# Patient Record
Sex: Female | Born: 1955 | Race: White | Hispanic: No | Marital: Single | Smoking: Current every day smoker
Health system: Southern US, Community
[De-identification: ages and names within clinical notes are randomized; demographics above are authoritative.]

## PROBLEM LIST (undated history)

## (undated) ENCOUNTER — Telehealth

## (undated) ENCOUNTER — Encounter

## (undated) ENCOUNTER — Ambulatory Visit
Attending: Rehabilitative and Restorative Service Providers" | Primary: Rehabilitative and Restorative Service Providers"

## (undated) ENCOUNTER — Encounter: Attending: Dermatology | Primary: Dermatology

## (undated) ENCOUNTER — Ambulatory Visit

## (undated) ENCOUNTER — Encounter: Attending: Family | Primary: Family

## (undated) ENCOUNTER — Ambulatory Visit: Attending: Family | Primary: Family

## (undated) ENCOUNTER — Telehealth: Attending: Family | Primary: Family

## (undated) ENCOUNTER — Ambulatory Visit
Attending: Pharmacist Clinician (PhC)/ Clinical Pharmacy Specialist | Primary: Pharmacist Clinician (PhC)/ Clinical Pharmacy Specialist

## (undated) ENCOUNTER — Encounter: Attending: Physical Medicine & Rehabilitation | Primary: Physical Medicine & Rehabilitation

## (undated) ENCOUNTER — Encounter: Attending: Medical | Primary: Medical

## (undated) ENCOUNTER — Ambulatory Visit: Payer: MEDICARE | Attending: Gastroenterology | Primary: Gastroenterology

## (undated) ENCOUNTER — Encounter: Attending: Family Medicine | Primary: Family Medicine

## (undated) ENCOUNTER — Encounter: Attending: Cardiovascular Disease | Primary: Cardiovascular Disease

## (undated) ENCOUNTER — Encounter: Attending: Registered" | Primary: Registered"

## (undated) ENCOUNTER — Telehealth: Attending: Cardiovascular Disease | Primary: Cardiovascular Disease

## (undated) ENCOUNTER — Ambulatory Visit: Attending: Physical Medicine & Rehabilitation | Primary: Physical Medicine & Rehabilitation

## (undated) ENCOUNTER — Ambulatory Visit: Payer: MEDICARE

## (undated) ENCOUNTER — Encounter
Attending: Pharmacist Clinician (PhC)/ Clinical Pharmacy Specialist | Primary: Pharmacist Clinician (PhC)/ Clinical Pharmacy Specialist

## (undated) DIAGNOSIS — B1920 Unspecified viral hepatitis C without hepatic coma: Secondary | ICD-10-CM

## (undated) HISTORY — PX: TUBAL LIGATION: SHX77

---

## 1998-05-21 ENCOUNTER — Emergency Department (HOSPITAL_COMMUNITY): Admission: EM | Admit: 1998-05-21 | Discharge: 1998-05-21 | Payer: Self-pay | Admitting: *Deleted

## 1998-08-25 ENCOUNTER — Encounter: Payer: Self-pay | Admitting: Emergency Medicine

## 1998-08-25 ENCOUNTER — Emergency Department (HOSPITAL_COMMUNITY): Admission: EM | Admit: 1998-08-25 | Discharge: 1998-08-25 | Payer: Self-pay | Admitting: Emergency Medicine

## 1998-08-26 ENCOUNTER — Other Ambulatory Visit: Admission: RE | Admit: 1998-08-26 | Discharge: 1998-08-26 | Payer: Self-pay | Admitting: Family Medicine

## 1999-01-08 ENCOUNTER — Emergency Department (HOSPITAL_COMMUNITY): Admission: EM | Admit: 1999-01-08 | Discharge: 1999-01-08 | Payer: Self-pay | Admitting: Emergency Medicine

## 1999-09-01 ENCOUNTER — Emergency Department (HOSPITAL_COMMUNITY): Admission: EM | Admit: 1999-09-01 | Discharge: 1999-09-01 | Payer: Self-pay | Admitting: Emergency Medicine

## 1999-09-01 ENCOUNTER — Encounter: Payer: Self-pay | Admitting: Emergency Medicine

## 2000-07-19 ENCOUNTER — Inpatient Hospital Stay (HOSPITAL_COMMUNITY): Admission: AD | Admit: 2000-07-19 | Discharge: 2000-07-19 | Payer: Self-pay | Admitting: *Deleted

## 2000-07-19 ENCOUNTER — Encounter: Payer: Self-pay | Admitting: *Deleted

## 2000-07-21 ENCOUNTER — Inpatient Hospital Stay (HOSPITAL_COMMUNITY): Admission: AD | Admit: 2000-07-21 | Discharge: 2000-07-21 | Payer: Self-pay | Admitting: Obstetrics & Gynecology

## 2000-12-09 ENCOUNTER — Other Ambulatory Visit: Admission: RE | Admit: 2000-12-09 | Discharge: 2000-12-09 | Payer: Self-pay | Admitting: *Deleted

## 2001-12-05 ENCOUNTER — Other Ambulatory Visit: Admission: RE | Admit: 2001-12-05 | Discharge: 2001-12-05 | Payer: Self-pay | Admitting: Obstetrics and Gynecology

## 2002-07-03 ENCOUNTER — Emergency Department (HOSPITAL_COMMUNITY): Admission: EM | Admit: 2002-07-03 | Discharge: 2002-07-03 | Payer: Self-pay | Admitting: Emergency Medicine

## 2002-09-18 ENCOUNTER — Encounter: Payer: Self-pay | Admitting: *Deleted

## 2002-09-18 ENCOUNTER — Inpatient Hospital Stay (HOSPITAL_COMMUNITY): Admission: AD | Admit: 2002-09-18 | Discharge: 2002-09-18 | Payer: Self-pay | Admitting: *Deleted

## 2002-10-22 ENCOUNTER — Other Ambulatory Visit: Admission: RE | Admit: 2002-10-22 | Discharge: 2002-10-22 | Payer: Self-pay | Admitting: *Deleted

## 2002-10-23 ENCOUNTER — Encounter: Payer: Self-pay | Admitting: *Deleted

## 2002-10-23 ENCOUNTER — Encounter: Admission: RE | Admit: 2002-10-23 | Discharge: 2002-10-23 | Payer: Self-pay | Admitting: *Deleted

## 2003-02-28 ENCOUNTER — Other Ambulatory Visit: Admission: RE | Admit: 2003-02-28 | Discharge: 2003-02-28 | Payer: Self-pay

## 2003-03-07 ENCOUNTER — Encounter: Payer: Self-pay | Admitting: Gastroenterology

## 2003-03-07 ENCOUNTER — Encounter (INDEPENDENT_AMBULATORY_CARE_PROVIDER_SITE_OTHER): Payer: Self-pay | Admitting: Specialist

## 2003-03-07 ENCOUNTER — Ambulatory Visit (HOSPITAL_COMMUNITY): Admission: RE | Admit: 2003-03-07 | Discharge: 2003-03-07 | Payer: Self-pay | Admitting: Gastroenterology

## 2003-03-25 ENCOUNTER — Ambulatory Visit (HOSPITAL_COMMUNITY): Admission: RE | Admit: 2003-03-25 | Discharge: 2003-03-25 | Payer: Self-pay

## 2003-03-25 ENCOUNTER — Encounter (INDEPENDENT_AMBULATORY_CARE_PROVIDER_SITE_OTHER): Payer: Self-pay | Admitting: Specialist

## 2003-07-11 ENCOUNTER — Other Ambulatory Visit: Admission: RE | Admit: 2003-07-11 | Discharge: 2003-07-11 | Payer: Self-pay

## 2003-08-27 ENCOUNTER — Emergency Department (HOSPITAL_COMMUNITY): Admission: EM | Admit: 2003-08-27 | Discharge: 2003-08-27 | Payer: Self-pay | Admitting: Emergency Medicine

## 2004-01-21 ENCOUNTER — Inpatient Hospital Stay (HOSPITAL_COMMUNITY): Admission: AD | Admit: 2004-01-21 | Discharge: 2004-01-21 | Payer: Self-pay | Admitting: *Deleted

## 2005-09-16 ENCOUNTER — Emergency Department (HOSPITAL_COMMUNITY): Admission: EM | Admit: 2005-09-16 | Discharge: 2005-09-16 | Payer: Self-pay | Admitting: Family Medicine

## 2006-02-08 ENCOUNTER — Emergency Department (HOSPITAL_COMMUNITY): Admission: EM | Admit: 2006-02-08 | Discharge: 2006-02-08 | Payer: Self-pay | Admitting: Family Medicine

## 2006-07-30 ENCOUNTER — Emergency Department (HOSPITAL_COMMUNITY): Admission: EM | Admit: 2006-07-30 | Discharge: 2006-07-30 | Payer: Self-pay | Admitting: Emergency Medicine

## 2006-11-16 ENCOUNTER — Emergency Department (HOSPITAL_COMMUNITY): Admission: EM | Admit: 2006-11-16 | Discharge: 2006-11-16 | Payer: Self-pay | Admitting: Emergency Medicine

## 2007-11-11 ENCOUNTER — Emergency Department (HOSPITAL_COMMUNITY): Admission: EM | Admit: 2007-11-11 | Discharge: 2007-11-11 | Payer: Self-pay | Admitting: Family Medicine

## 2008-08-11 ENCOUNTER — Emergency Department (HOSPITAL_COMMUNITY): Admission: EM | Admit: 2008-08-11 | Discharge: 2008-08-11 | Payer: Self-pay | Admitting: Family Medicine

## 2010-07-17 ENCOUNTER — Emergency Department (HOSPITAL_COMMUNITY): Admission: EM | Admit: 2010-07-17 | Discharge: 2010-07-17 | Payer: Self-pay | Admitting: Emergency Medicine

## 2010-07-20 ENCOUNTER — Inpatient Hospital Stay (HOSPITAL_COMMUNITY)
Admission: EM | Admit: 2010-07-20 | Discharge: 2010-07-21 | Payer: Self-pay | Source: Home / Self Care | Admitting: Emergency Medicine

## 2011-01-28 LAB — DIFFERENTIAL
Basophils Absolute: 0.1 10*3/uL (ref 0.0–0.1)
Basophils Relative: 1 % (ref 0–1)
Basophils Relative: 1 % (ref 0–1)
Eosinophils Absolute: 0.2 10*3/uL (ref 0.0–0.7)
Eosinophils Absolute: 0.2 10*3/uL (ref 0.0–0.7)
Eosinophils Relative: 2 % (ref 0–5)
Lymphocytes Relative: 43 % (ref 12–46)
Lymphs Abs: 3.3 10*3/uL (ref 0.7–4.0)
Lymphs Abs: 4.2 10*3/uL — ABNORMAL HIGH (ref 0.7–4.0)
Neutro Abs: 3 10*3/uL (ref 1.7–7.7)
Neutrophils Relative %: 42 % — ABNORMAL LOW (ref 43–77)
Neutrophils Relative %: 47 % (ref 43–77)

## 2011-01-28 LAB — CBC
Hemoglobin: 14.5 g/dL (ref 12.0–15.0)
Hemoglobin: 14.7 g/dL (ref 12.0–15.0)
MCH: 32.6 pg (ref 26.0–34.0)
MCH: 33.4 pg (ref 26.0–34.0)
MCHC: 34.1 g/dL (ref 30.0–36.0)
MCHC: 34.8 g/dL (ref 30.0–36.0)
MCV: 95.6 fL (ref 78.0–100.0)
RBC: 4.33 MIL/uL (ref 3.87–5.11)
RBC: 4.34 MIL/uL (ref 3.87–5.11)
RDW: 12.9 % (ref 11.5–15.5)
RDW: 13 % (ref 11.5–15.5)

## 2011-01-28 LAB — BASIC METABOLIC PANEL
BUN: 10 mg/dL (ref 6–23)
CO2: 26 mEq/L (ref 19–32)
Calcium: 8.8 mg/dL (ref 8.4–10.5)
Chloride: 109 mEq/L (ref 96–112)
Glucose, Bld: 96 mg/dL (ref 70–99)
Potassium: 3.7 mEq/L (ref 3.5–5.1)
Sodium: 139 mEq/L (ref 135–145)

## 2011-01-28 LAB — COMPREHENSIVE METABOLIC PANEL
AST: 58 U/L — ABNORMAL HIGH (ref 0–37)
Alkaline Phosphatase: 72 U/L (ref 39–117)
Calcium: 8.8 mg/dL (ref 8.4–10.5)
Creatinine, Ser: 0.63 mg/dL (ref 0.4–1.2)
GFR calc Af Amer: >60 mL/min (ref >60)
GFR calc non Af Amer: >60 mL/min (ref >60)
Potassium: 3.7 mEq/L (ref 3.5–5.1)
Sodium: 138 mEq/L (ref 135–145)
Total Bilirubin: 0.7 mg/dL (ref 0.3–1.2)

## 2011-01-28 LAB — DIC (DISSEMINATED INTRAVASCULAR COAGULATION)PANEL
Fibrinogen: 351 mg/dL (ref 204–475)
Platelets: 160 10*3/uL (ref 150–400)
Smear Review: NONE SEEN

## 2011-01-28 LAB — FIBRINOGEN: Fibrinogen: 375 mg/dL (ref 204–475)
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
## 2011-06-04 ENCOUNTER — Emergency Department (HOSPITAL_COMMUNITY): Payer: Self-pay

## 2011-06-04 ENCOUNTER — Emergency Department (HOSPITAL_COMMUNITY)
Admission: EM | Admit: 2011-06-04 | Discharge: 2011-06-04 | Disposition: A | Discharge status: Home / Self Care | Payer: Self-pay | Attending: Emergency Medicine | Admitting: Emergency Medicine

## 2011-06-04 DIAGNOSIS — R209 Unspecified disturbances of skin sensation: Secondary | ICD-10-CM | POA: Insufficient documentation

## 2011-06-04 DIAGNOSIS — H538 Other visual disturbances: Secondary | ICD-10-CM | POA: Insufficient documentation

## 2011-06-04 DIAGNOSIS — F17200 Nicotine dependence, unspecified, uncomplicated: Secondary | ICD-10-CM | POA: Insufficient documentation

## 2011-06-04 DIAGNOSIS — R42 Dizziness and giddiness: Secondary | ICD-10-CM | POA: Insufficient documentation

## 2011-06-04 DIAGNOSIS — M25569 Pain in unspecified knee: Secondary | ICD-10-CM | POA: Insufficient documentation

## 2011-06-04 DIAGNOSIS — H53149 Visual discomfort, unspecified: Secondary | ICD-10-CM | POA: Insufficient documentation

## 2011-06-04 LAB — CBC
HCT: 44.6 % (ref 36.0–46.0)
Hemoglobin: 16 g/dL — ABNORMAL HIGH (ref 12.0–15.0)
MCHC: 35.9 g/dL (ref 30.0–36.0)
RBC: 4.79 MIL/uL (ref 3.87–5.11)
RDW: 12.8 % (ref 11.5–15.5)

## 2011-06-04 LAB — URINALYSIS, ROUTINE W REFLEX MICROSCOPIC
Bilirubin Urine: NEGATIVE
Hgb urine dipstick: NEGATIVE
Leukocytes, UA: NEGATIVE
Nitrite: NEGATIVE
Protein, ur: NEGATIVE mg/dL

## 2011-06-04 LAB — DIFFERENTIAL
Basophils Absolute: 0.1 10*3/uL (ref 0.0–0.1)
Basophils Relative: 1 % (ref 0–1)
Eosinophils Absolute: 0.5 10*3/uL (ref 0.0–0.7)
Eosinophils Relative: 4 % (ref 0–5)
Lymphocytes Relative: 50 % — ABNORMAL HIGH (ref 12–46)
Lymphs Abs: 5.8 10*3/uL — ABNORMAL HIGH (ref 0.7–4.0)
Neutro Abs: 4.2 10*3/uL (ref 1.7–7.7)
Neutrophils Relative %: 36 % — ABNORMAL LOW (ref 43–77)

## 2011-06-04 LAB — BASIC METABOLIC PANEL
BUN: 14 mg/dL (ref 6–23)
Calcium: 9.6 mg/dL (ref 8.4–10.5)
Creatinine, Ser: 0.61 mg/dL (ref 0.50–1.10)
GFR calc non Af Amer: >60 mL/min (ref >60)
Potassium: 3.8 mEq/L (ref 3.5–5.1)
Sodium: 136 mEq/L (ref 135–145)

## 2011-06-04 LAB — GLUCOSE, CAPILLARY

## 2011-08-16 LAB — POCT URINALYSIS DIP (DEVICE)
Glucose, UA: 250 — AB
Nitrite: POSITIVE — AB
Protein, ur: >=300 — AB
Urobilinogen, UA: >=8
pH: 5

## 2011-09-12 ENCOUNTER — Emergency Department (HOSPITAL_COMMUNITY)
Admission: EM | Admit: 2011-09-12 | Discharge: 2011-09-12 | Disposition: A | Discharge status: Home / Self Care | Payer: Self-pay | Attending: Emergency Medicine | Admitting: Emergency Medicine

## 2011-09-12 ENCOUNTER — Emergency Department (HOSPITAL_COMMUNITY): Payer: Self-pay

## 2011-09-12 DIAGNOSIS — Z8619 Personal history of other infectious and parasitic diseases: Secondary | ICD-10-CM | POA: Insufficient documentation

## 2011-09-12 DIAGNOSIS — R6884 Jaw pain: Secondary | ICD-10-CM | POA: Insufficient documentation

## 2011-09-12 DIAGNOSIS — L0201 Cutaneous abscess of face: Secondary | ICD-10-CM | POA: Insufficient documentation

## 2011-09-12 DIAGNOSIS — L03211 Cellulitis of face: Secondary | ICD-10-CM | POA: Insufficient documentation

## 2011-09-12 DIAGNOSIS — R51 Headache: Secondary | ICD-10-CM | POA: Insufficient documentation

## 2011-09-12 DIAGNOSIS — H9209 Otalgia, unspecified ear: Secondary | ICD-10-CM | POA: Insufficient documentation

## 2011-09-12 MED ORDER — IOHEXOL 300 MG/ML  SOLN
80.0000 mL | Freq: Once | INTRAMUSCULAR | Status: AC | PRN
  Administered 2011-09-12: 80 mL via INTRAVENOUS

## 2012-05-06 ENCOUNTER — Emergency Department (HOSPITAL_COMMUNITY)
Admission: EM | Admit: 2012-05-06 | Discharge: 2012-05-06 | Disposition: A | Discharge status: Home / Self Care | Payer: Self-pay | Attending: Emergency Medicine | Admitting: Emergency Medicine

## 2012-05-06 ENCOUNTER — Emergency Department (HOSPITAL_COMMUNITY): Payer: Self-pay

## 2012-05-06 ENCOUNTER — Encounter (HOSPITAL_COMMUNITY): Payer: Self-pay | Admitting: Emergency Medicine

## 2012-05-06 DIAGNOSIS — F17200 Nicotine dependence, unspecified, uncomplicated: Secondary | ICD-10-CM | POA: Insufficient documentation

## 2012-05-06 DIAGNOSIS — L03211 Cellulitis of face: Secondary | ICD-10-CM | POA: Insufficient documentation

## 2012-05-06 DIAGNOSIS — L0201 Cutaneous abscess of face: Secondary | ICD-10-CM | POA: Insufficient documentation

## 2012-05-06 DIAGNOSIS — J40 Bronchitis, not specified as acute or chronic: Secondary | ICD-10-CM | POA: Insufficient documentation

## 2012-05-06 MED ORDER — LIDOCAINE HCL 2 % IJ SOLN
10.0000 mL | Freq: Once | INTRAMUSCULAR | Status: AC
  Administered 2012-05-06: 200 mg via INTRADERMAL
  Filled 2012-05-06: qty 1

## 2012-05-06 MED ORDER — HYDROCODONE-ACETAMINOPHEN 5-325 MG PO TABS: 1.0000 | ORAL_TABLET | ORAL | Status: AC | PRN

## 2012-05-06 MED ORDER — HYDROCODONE-ACETAMINOPHEN 5-325 MG PO TABS
1.0000 | ORAL_TABLET | Freq: Once | ORAL | Status: AC
  Administered 2012-05-06: 1 via ORAL
  Filled 2012-05-06: qty 1

## 2012-05-06 MED ORDER — CEPHALEXIN 500 MG PO CAPS: 500.0000 mg | ORAL_CAPSULE | Freq: Four times a day (QID) | ORAL | Status: AC

## 2012-05-06 NOTE — ED Notes (Signed)
Pt. Stated, I started having nasal congestion with a cough and I have some type of bite on my lt. Cheek.I've taken Penicillin for 2 days some I had left over from a tooth.

## 2012-05-06 NOTE — Discharge Instructions (Signed)
Abscess An abscess (boil or furuncle) is an infected area that contains a collection of pus.  SYMPTOMS Signs and symptoms of an abscess include pain, tenderness, redness, or hardness. You may feel a moveable soft area under your skin. An abscess can occur anywhere in the body.  TREATMENT  A surgical cut (incision) may be made over your abscess to drain the pus. Gauze may be packed into the space or a drain may be looped through the abscess cavity (pocket). This provides a drain that will allow the cavity to heal from the inside outwards. The abscess may be painful for a few days, but should feel much better if it was drained.  Your abscess, if seen early, may not have localized and may not have been drained. If not, another appointment may be required if it does not get better on its own or with medications. HOME CARE INSTRUCTIONS   Only take over-the-counter or prescription medicines for pain, discomfort, or fever as directed by your caregiver.   Take your antibiotics as directed if they were prescribed. Finish them even if you start to feel better.   Keep the skin and clothes clean around your abscess.   If the abscess was drained, you will need to use gauze dressing to collect any draining pus. Dressings will typically need to be changed 3 or more times a day.   The infection may spread by skin contact with others. Avoid skin contact as much as possible.   Practice good hygiene. This includes regular hand washing, cover any draining skin lesions, and do not share personal care items.   If you participate in sports, do not share athletic equipment, towels, whirlpools, or personal care items. Shower after every practice or tournament.   If a draining area cannot be adequately covered:   Do not participate in sports.   Children should not participate in day care until the wound has healed or drainage stops.   If your caregiver has given you a follow-up appointment, it is very important  to keep that appointment. Not keeping the appointment could result in a much worse infection, chronic or permanent injury, pain, and disability. If there is any problem keeping the appointment, you must call back to this facility for assistance.  SEEK MEDICAL CARE IF:   You develop increased pain, swelling, redness, drainage, or bleeding in the wound site.   You develop signs of generalized infection including muscle aches, chills, fever, or a general ill feeling.   You have an oral temperature above 102 F (38.9 C).  MAKE SURE YOU:   Understand these instructions.   Will watch your condition.   Will get help right away if you are not doing well or get worse.  Document Released: 08/11/2005 Document Revised: 10/21/2011 Document Reviewed: 06/04/2008 [facility] Patient Information 2012 [place], [place].Bronchitis Bronchitis is a problem of the air tubes leading to your lungs. This problem makes it hard for air to get in and out of the lungs. You may cough a lot because your air tubes are narrow. Going without care can cause lasting (chronic) bronchitis. HOME CARE   Drink enough fluids to keep your pee (urine) clear or pale yellow.   Use a cool mist humidifier.   Quit smoking if you smoke. If you keep smoking, the bronchitis might not get better.   Only take medicine as told by your doctor.  GET HELP RIGHT AWAY IF:   Coughing keeps you awake.   You start to wheeze.  You become more sick or weak.   You have a hard time breathing or get short of breath.   You cough up blood.   Coughing lasts more than 2 weeks.   You have a fever.   Your baby is older than 3 months with a rectal temperature of 102 F (38.9 C) or higher.   Your baby is 101 months old or younger with a rectal temperature of 100.4 F (38 C) or higher.  MAKE SURE YOU:  Understand these instructions.   Will watch your condition.   Will get help right away if you are not doing well or get worse.  Document  Released: 04/19/2008 Document Revised: 10/21/2011 Document Reviewed: 10/03/2009 [facility] Patient Information 2012 [place], [place].Bronchitis Bronchitis is a problem of the air tubes leading to your lungs. This problem makes it hard for air to get in and out of the lungs. You may cough a lot because your air tubes are narrow. Going without care can cause lasting (chronic) bronchitis. HOME CARE   Drink enough fluids to keep your pee (urine) clear or pale yellow.   Use a cool mist humidifier.   Quit smoking if you smoke. If you keep smoking, the bronchitis might not get better.   Only take medicine as told by your doctor.  GET HELP RIGHT AWAY IF:   Coughing keeps you awake.   You start to wheeze.   You become more sick or weak.   You have a hard time breathing or get short of breath.   You cough up blood.   Coughing lasts more than 2 weeks.   You have a fever.   Your baby is older than 3 months with a rectal temperature of 102 F (38.9 C) or higher.   Your baby is 57 months old or younger with a rectal temperature of 100.4 F (38 C) or higher.  MAKE SURE YOU:  Understand these instructions.   Will watch your condition.   Will get help right away if you are not doing well or get worse.  Document Released: 04/19/2008 Document Revised: 10/21/2011 Document Reviewed: 10/03/2009 [facility] Patient Information 2012 [place], [place].

## 2012-05-06 NOTE — ED Notes (Signed)
Patient stable upon discharge, discharge instructions given to patient and prescription for keflex and norco given to patient.  Patient education completed. All belongings with patient upon discharge to home. Jory Sims Sia

## 2012-05-06 NOTE — ED Provider Notes (Signed)
History     CSN: [id number]  Arrival date & time 05/06/12  0946   First MD Initiated Contact with Patient 05/06/12 [phone]      Chief Complaint  Patient presents with  . Nasal Congestion  . Shortness of Breath    (Consider location/radiation/quality/duration/timing/severity/associated sxs/prior treatment) HPI Comments: The patient a 56 year old woman who has had intermittent cough for several weeks. She is also felt dizziness and lightheadedness. She is also noted an abscess on the left side of her face. She had some penicillin 500 mg tablets left over from a dental appointment several months ago, and took those for 3 days without relief. She also tried DayQuil and NyQuil without relief. She therefore sought evaluation.  Patient is a 55 y.o. female presenting with cough. The history is provided by the patient. No language interpreter was used.  Cough This is a recurrent problem. The current episode started more than 1 week ago. The problem occurs hourly. The problem has not changed since onset.The cough is non-productive. There has been no fever. Associated symptoms include shortness of breath. Pertinent negatives include no chills. Treatments tried: She has taken Pen-Vee K, and also DayQuil and NyQuil, without relief. She is a smoker.    History reviewed. No pertinent past medical history.  History reviewed. No pertinent past surgical history.  No family history on file.  History  Substance Use Topics  . Smoking status: Current Everyday Smoker    Types: Cigarettes  . Smokeless tobacco: Not on file  . Alcohol Use: Yes    OB History    Grav Para Term Preterm Abortions TAB SAB Ect Mult Living                  Review of Systems  Constitutional: Negative for fever and chills.  HENT: Positive for facial swelling.        She has an abscess on her left cheek.  Eyes: Negative.   Respiratory: Positive for cough and shortness of breath.   Cardiovascular: Negative.     Gastrointestinal: Negative.   Genitourinary: Negative.   Musculoskeletal: Negative.   Neurological: Positive for dizziness.  Psychiatric/Behavioral: Negative.     Allergies  Review of patient's allergies indicates no known allergies.  Home Medications   Current Outpatient Rx  Name Route Sig Dispense Refill  . ADULT MULTIVITAMIN W/MINERALS CH Oral Take 1 tablet by mouth daily.      BP 112/70  Pulse 81  Temp 98.4 F (36.9 C)  Resp 16  SpO2 98%  Physical Exam  Nursing note and vitals reviewed. Constitutional: She is oriented to person, place, and time.  HENT:  Head: Normocephalic and atraumatic.  Right Ear: External ear normal.  Left Ear: External ear normal.  Mouth/Throat: Oropharynx is clear and moist.       She has a 2 x 2 centimeter abscess on the left cheek, with slight drainage, but with continued fluctuance.  Eyes: Conjunctivae and EOM are normal. Pupils are equal, round, and reactive to light.  Neck: Normal range of motion. Neck supple.  Cardiovascular: Normal rate, regular rhythm and normal heart sounds.   Pulmonary/Chest: Effort normal.       Distant breath sounds  Abdominal: Soft. Bowel sounds are normal.  Musculoskeletal: Normal range of motion. She exhibits no edema.  Neurological: She is alert and oriented to person, place, and time.       No sensory or motor deficit.  Skin: Skin is warm and dry.  Psychiatric: She has a  normal mood and affect. Her behavior is normal.    ED Course  INCISION AND DRAINAGE Date/Time: 05/06/2012 1:58 PM Performed by: Osvaldo Human Authorized by: Osvaldo Human Consent: Verbal consent obtained. Risks and benefits: risks, benefits and alternatives were discussed Consent given by: patient Patient understanding: patient states understanding of the procedure being performed Patient consent: the patient's understanding of the procedure matches consent given Site marked: the operative site was not marked Time out:  Immediately prior to procedure a "time out" was called to verify the correct patient, procedure, equipment, support staff and site/side marked as required. Type: abscess Body area: head/neck Location details: face Anesthesia: local infiltration Local anesthetic: lidocaine 2% without epinephrine Patient sedated: no Scalpel size: 11 Incision type: single straight Complexity: simple Drainage: purulent Drainage amount: scant Wound treatment: wound left open Patient tolerance: Patient tolerated the procedure well with no immediate complications. Comments: C&S of pus obtained.   (including critical care time)   Labs Reviewed  CULTURE, ROUTINE-ABSCESS   10:36 AM Patient was seen and had physical examination. She has a nonproductive cough has been present for several weeks. She is a smoker. A chest x-ray was ordered. She also has an abscess on the left cheek overlying the left zygomatic arch. This will need incision and drainage.  12:43 PM Chest x-ray was negative.  Discussed I&D of abscess with pt.  Asked tech to set that up. Vicodin for pt's pain.  2:00 PM Had I&D.  Rx Keflex, vicodin.  Advised to stop smoking.  1. Facial abscess   2. Bronchitis         Carleene Cooper III, MD 05/06/12 [phone]

## 2012-05-09 LAB — CULTURE, ROUTINE-ABSCESS

## 2012-05-10 NOTE — ED Notes (Addendum)
+   MRSA I/D done Patient treated with keflex

## 2012-05-10 NOTE — ED Notes (Signed)
Chart returned from EDP office with orders for : bactrim DS 2 tabs po BID x 5 days,return for worsing systems,fever per [facility].

## 2012-05-15 NOTE — ED Notes (Signed)
RX called to [facility] [phone]- by Sheralyn Boatman PFM.

## 2013-03-24 IMAGING — CT CT MAXILLOFACIAL W/ CM
3 of 4 series · 16 of 47 positions shown, 19 images · IV contrast (omnipaque)
Comparison: Head CT dated 06/04/2011

CLINICAL DATA: Right cheek swelling / redness

CT MAXILLOFACIAL WITH CONTRAST
TECHNIQUE: Multidetector CT imaging of the maxillofacial
structures was performed with intravenous contrast. Multiplanar CT
image reconstructions were also generated.
Contrast: 80mL OMNIPAQUE IOHEXOL 300 MG/ML IV SOLN

[Series 3: facial bones · axial · 0.35mm/px · z∈[+6,+152]mm · 10 of 85 slices shown, 13 images]
[im 6/85  brain]
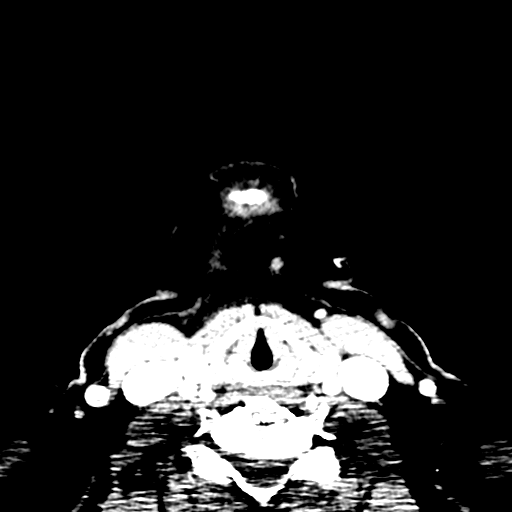
[im 6/85  bone]
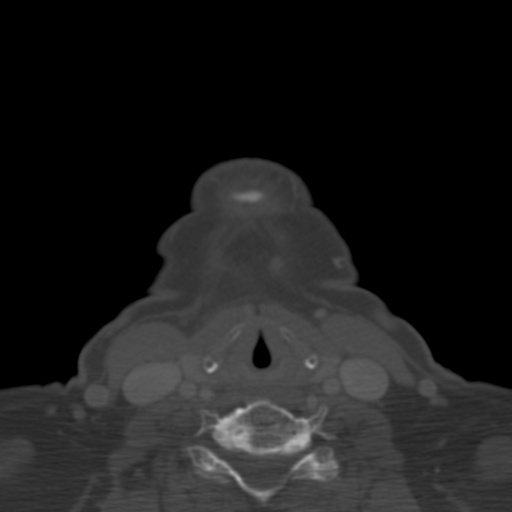
[im 15/85  bone]
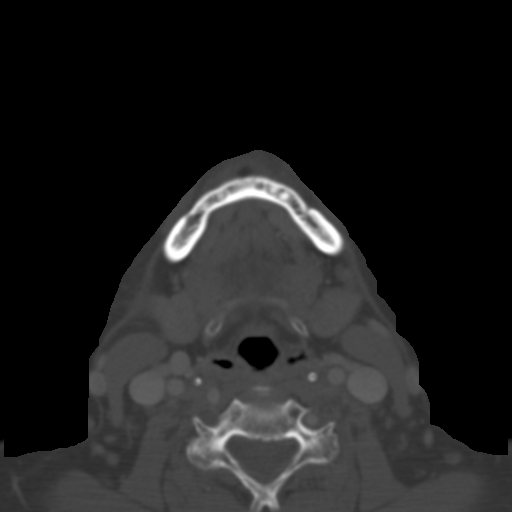
[im 24/85  bone]
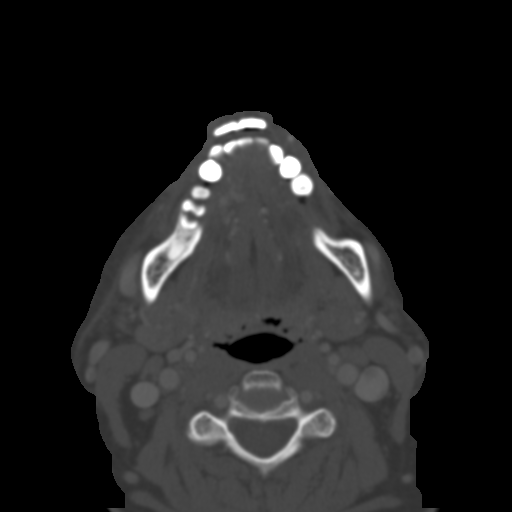
[im 29/85  bone]
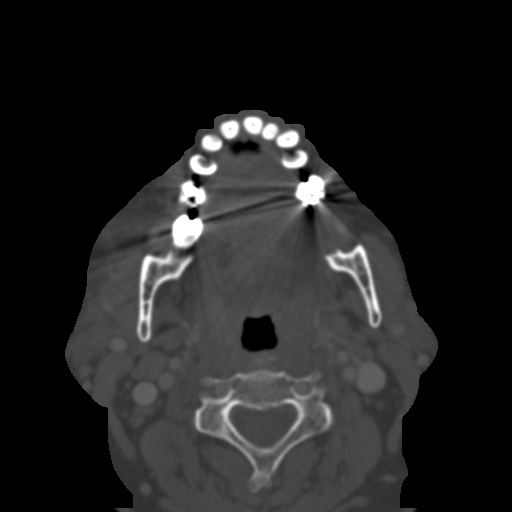
[im 38/85  brain]
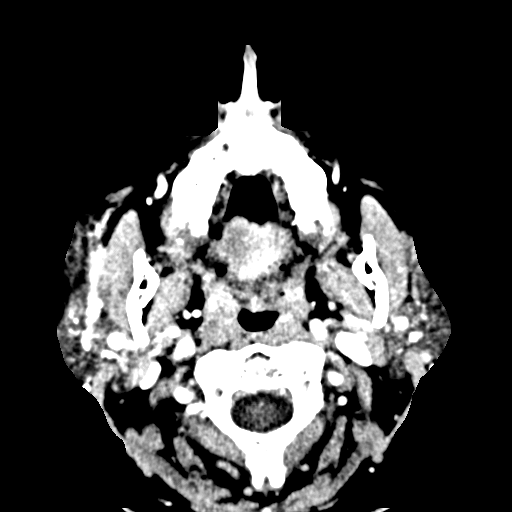
[im 38/85  bone]
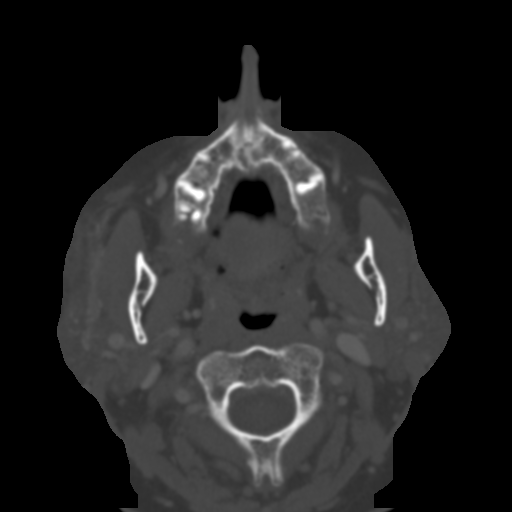
[im 47/85  bone]
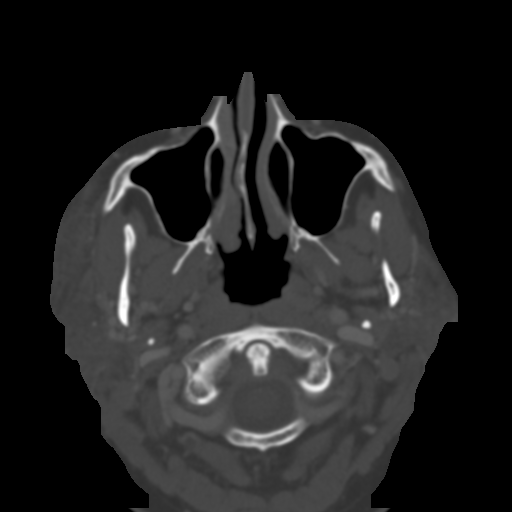
[im 56/85  bone]
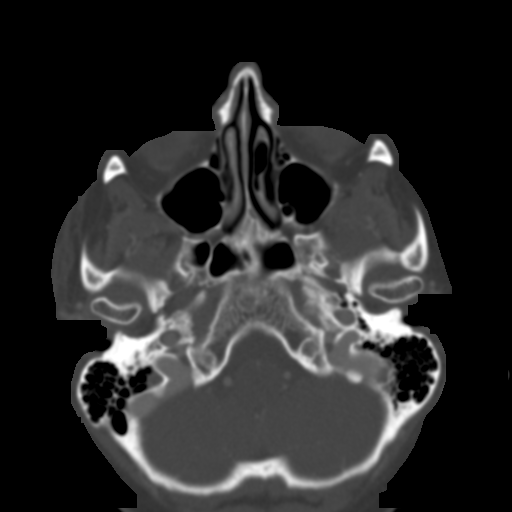
[im 64/85  bone]
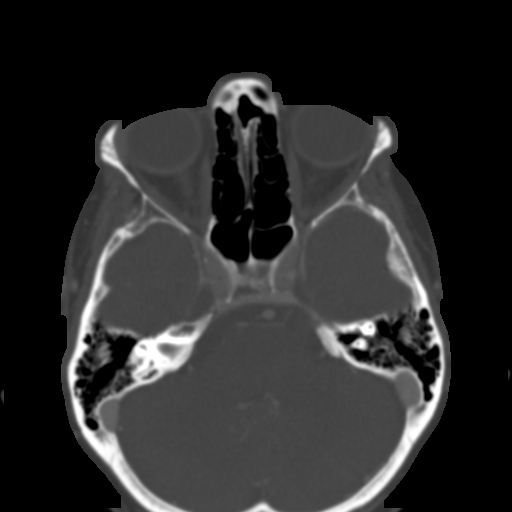
[im 70/85  brain]
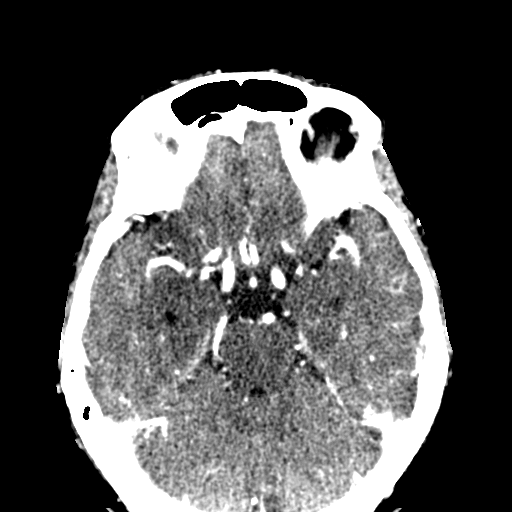
[im 70/85  bone]
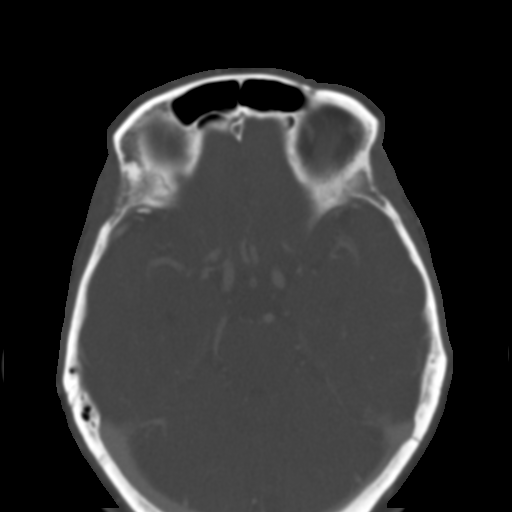
[im 79/85  bone]
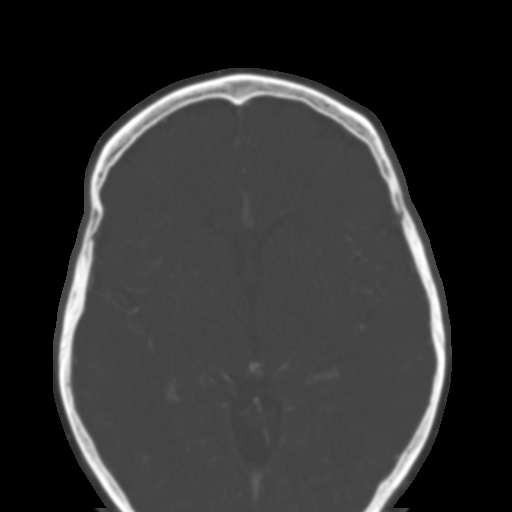

[mpr, sagittal std · sagittal · 0.35mm/px · 3 of 78 slices shown]
[im 26/78  bone]
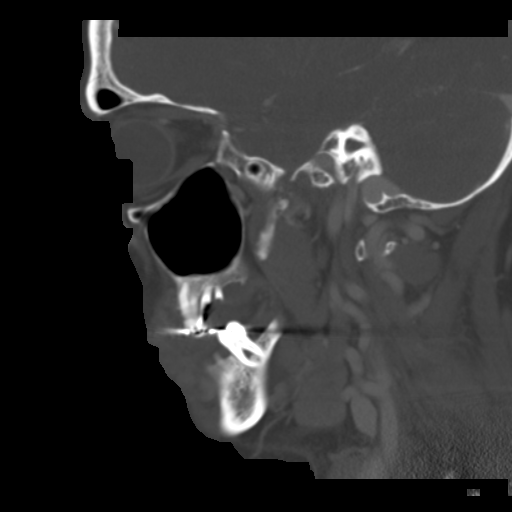
[im 39/78  bone]
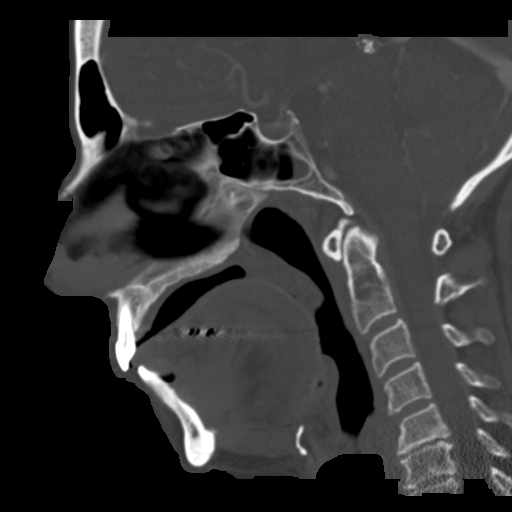
[im 52/78  bone]
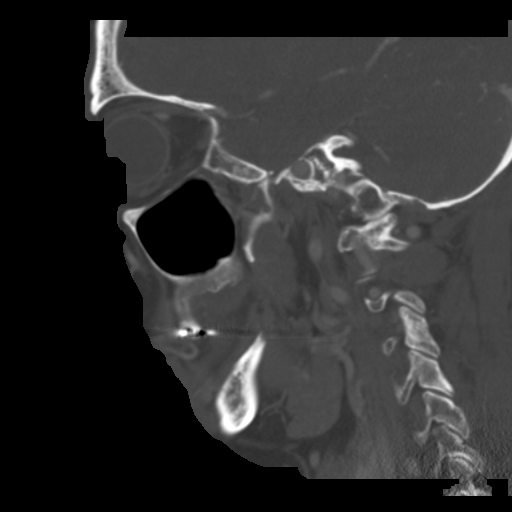

[mpr, coronal bone · coronal · 0.35mm/px · 3 of 83 slices shown]
[im 21/83  bone]
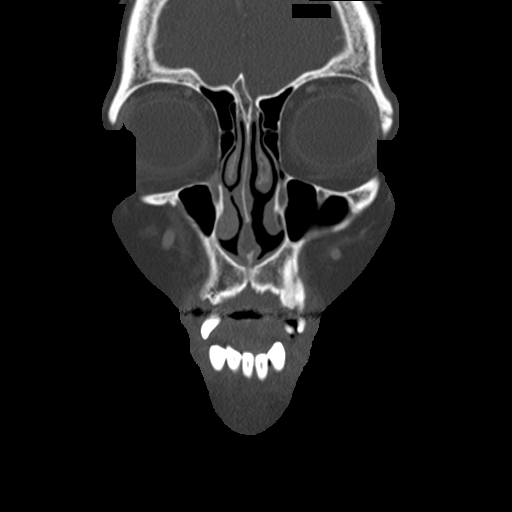
[im 42/83  bone]
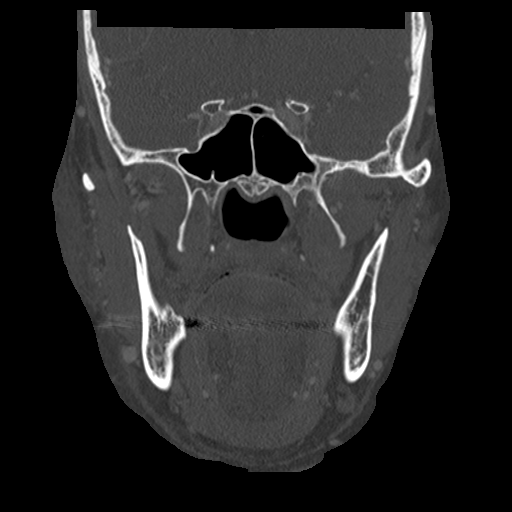
[im 62/83  bone]
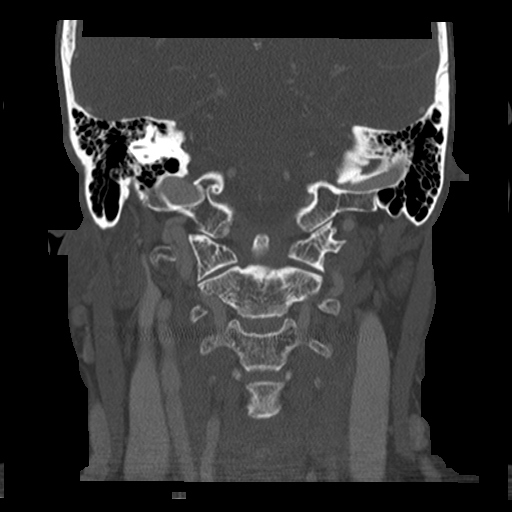

[16 of 47 positions shown; findings below may reference images not displayed]

FINDINGS: Subcutaneous stranding/inflammatory changes involving the
right cheek (series 3/image 42).  No associated drainable fluid
collection or abscess.

The visualized paranasal sinuses are essentially clear. The mastoid
air cells are unopacified.

The bilateral orbits, including the retroconal soft tissues, are
within normal limits.

No evidence of maxillofacial fracture.

Visualized brain parenchyma is unremarkable.

The visualized cervical spine appears intact.
IMPRESSION: Subcutaneous stranding/inflammatory changes involving the right
cheek, compatible with cellulitis.  No drainable fluid collection
or abscess.

## 2013-07-08 ENCOUNTER — Encounter (HOSPITAL_COMMUNITY): Payer: Self-pay | Admitting: *Deleted

## 2013-07-08 ENCOUNTER — Emergency Department (HOSPITAL_COMMUNITY)
Admission: EM | Admit: 2013-07-08 | Discharge: 2013-07-08 | Disposition: A | Discharge status: Home / Self Care | Payer: Self-pay | Attending: Emergency Medicine | Admitting: Emergency Medicine

## 2013-07-08 DIAGNOSIS — Z8619 Personal history of other infectious and parasitic diseases: Secondary | ICD-10-CM | POA: Insufficient documentation

## 2013-07-08 DIAGNOSIS — Z79899 Other long term (current) drug therapy: Secondary | ICD-10-CM | POA: Insufficient documentation

## 2013-07-08 DIAGNOSIS — M25549 Pain in joints of unspecified hand: Secondary | ICD-10-CM | POA: Insufficient documentation

## 2013-07-08 DIAGNOSIS — R300 Dysuria: Secondary | ICD-10-CM | POA: Insufficient documentation

## 2013-07-08 DIAGNOSIS — R509 Fever, unspecified: Secondary | ICD-10-CM | POA: Insufficient documentation

## 2013-07-08 DIAGNOSIS — M25542 Pain in joints of left hand: Secondary | ICD-10-CM

## 2013-07-08 DIAGNOSIS — Z3202 Encounter for pregnancy test, result negative: Secondary | ICD-10-CM | POA: Insufficient documentation

## 2013-07-08 DIAGNOSIS — F17200 Nicotine dependence, unspecified, uncomplicated: Secondary | ICD-10-CM | POA: Insufficient documentation

## 2013-07-08 DIAGNOSIS — M545 Low back pain, unspecified: Secondary | ICD-10-CM | POA: Insufficient documentation

## 2013-07-08 DIAGNOSIS — N390 Urinary tract infection, site not specified: Secondary | ICD-10-CM | POA: Insufficient documentation

## 2013-07-08 DIAGNOSIS — M25541 Pain in joints of right hand: Secondary | ICD-10-CM

## 2013-07-08 HISTORY — DX: Unspecified viral hepatitis C without hepatic coma: B19.20

## 2013-07-08 LAB — POCT PREGNANCY, URINE: Preg Test, Ur: NEGATIVE

## 2013-07-08 LAB — URINE MICROSCOPIC-ADD ON

## 2013-07-08 LAB — URINALYSIS, ROUTINE W REFLEX MICROSCOPIC
Nitrite: POSITIVE — AB
Specific Gravity, Urine: 1.009 (ref 1.005–1.030)
Urobilinogen, UA: 0.2 mg/dL (ref 0.0–1.0)
pH: 6 (ref 5.0–8.0)

## 2013-07-08 MED ORDER — CEPHALEXIN 250 MG PO CAPS
500.0000 mg | ORAL_CAPSULE | Freq: Once | ORAL | Status: AC
  Administered 2013-07-08: 500 mg via ORAL
  Filled 2013-07-08: qty 2

## 2013-07-08 MED ORDER — TRAMADOL HCL 50 MG PO TABS: 50.0000 mg | ORAL_TABLET | Freq: Four times a day (QID) | ORAL | Status: AC | PRN

## 2013-07-08 MED ORDER — CEPHALEXIN 500 MG PO CAPS: 500.0000 mg | ORAL_CAPSULE | Freq: Two times a day (BID) | ORAL | Status: DC

## 2013-07-08 MED ORDER — CEPHALEXIN 500 MG PO CAPS: 500.0000 mg | ORAL_CAPSULE | Freq: Four times a day (QID) | ORAL | Status: DC

## 2013-07-08 NOTE — ED Notes (Signed)
Pt is here urinary symptoms of frequency and having lower back pain.  Pt is also complaining of some hand issues every morning with throbbing

## 2013-07-08 NOTE — ED Provider Notes (Signed)
CSN: [id number]     Arrival date & time 07/08/13  1020 History     First MD Initiated Contact with Patient 07/08/13 1107     Chief Complaint  Patient presents with  . Urinary Frequency   (Consider location/radiation/quality/duration/timing/severity/associated sxs/prior Treatment) HPI  Alisha Bennett is a 57 y.o. female complaining of dysuria, frequency, subjective fever and low back pain worsening over the course of the last 3 days. Patient denies nausea vomiting, hematuria, history of frequent urinary tract infections. Patient also reports a bilateral hand pain also starting approximately 3 days ago. 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 She denies any numbness, weakness, paresthesia she cannot identify any exact or alleviating factors. Rates her pain at 7/10, not alleviated with ibuprofen.   Past Medical History  Diagnosis Date  . Hepatitis C    Past Surgical History  Procedure Laterality Date  . Tubal ligation     No family history on file. History  Substance Use Topics  . Smoking status: Current Every Day Smoker    Types: Cigarettes  . Smokeless tobacco: Not on file  . Alcohol Use: Yes     Comment: occ   OB History   Grav Para Term Preterm Abortions TAB SAB Ect Mult Living                 Review of Systems 10 systems reviewed and found to be negative, except as noted in the HPI   Allergies  Review of patient's allergies indicates no known allergies.  Home Medications   Current Outpatient Rx  Name  Route  Sig  Dispense  Refill  . Multiple Vitamin (MULTIVITAMIN WITH MINERALS) TABS   Oral   Take 1 tablet by mouth daily.          BP 117/70  Pulse 82  Temp(Src) 98 F (36.7 C) (Oral)  Resp 18  SpO2 97% Physical Exam  Nursing note and vitals reviewed. Constitutional: She is oriented to person, place, and time. She appears well-developed and  well-nourished. No distress.  HENT:  Head: Normocephalic.  Mouth/Throat: Oropharynx is clear and moist.  Eyes: Conjunctivae and EOM are normal.  Cardiovascular: Normal rate, regular rhythm and intact distal pulses.   Pulmonary/Chest: Effort normal and breath sounds normal. No stridor. No respiratory distress. She has no wheezes. She has no rales. She exhibits no tenderness.  Abdominal: Soft. Bowel sounds are normal. She exhibits no distension and no mass. There is no tenderness. There is no rebound and no guarding.  Genitourinary:  No CVA TTP bilaterally  Musculoskeletal: Normal range of motion.  Bilateral hands show excellent range of motion, 4/5 strength bilaterally, distal sensation is intact, no signs of infection, Tinel and Phalen are negative bilaterally. Skin is normal with no erythema, warmth, signs of trauma.  Neurological: She is alert and oriented to person, place, and time.  Psychiatric: She has a normal mood and affect.    ED Course   Procedures (including critical care time)  Labs Reviewed  URINALYSIS, ROUTINE W REFLEX MICROSCOPIC - Abnormal; Notable for the following:    Color, Urine AMBER (*)    APPearance CLOUDY (*)    Hgb urine dipstick MODERATE (*)    Nitrite POSITIVE (*)    Leukocytes, UA LARGE (*)    All other components within normal limits  URINE MICROSCOPIC-ADD ON - Abnormal; Notable for the following:  Squamous Epithelial / LPF FEW (*)    Bacteria, UA MANY (*)    All other components within normal limits  URINE CULTURE  POCT PREGNANCY, URINE   No results found.  1. UTI (lower urinary tract infection)   2. Arthralgia of hand, left   3. Arthralgia of hand, right     MDM   Filed Vitals:   07/08/13 1026  BP: 117/70  Pulse: 82  Temp: 98 F (36.7 C)  TempSrc: Oral  Resp: 18  SpO2: 97%     [name] is a 57 y.o. female with urinary tract infection and bilateral hand pain. No signs of pyelonephritis. Patient is appropriate for  outpatient treatment. We have discussed return precautions for [place]. Patient is also describing a bilateral hand pain. This is probably a repetitive use arthralgia. Physical exam is reassuring. No emergent need for intervention or imaging at this time. I have asked her to follow with orthopedics on mass.  Medications  cephALEXin (KEFLEX) capsule 500 mg (500 mg Oral Given 07/08/13 1138)    Pt is hemodynamically stable, appropriate for, and amenable to discharge at this time. Pt verbalized understanding and agrees with care plan. All questions answered. Outpatient follow-up and specific return precautions discussed.    New Prescriptions   CEPHALEXIN (KEFLEX) 500 MG CAPSULE    Take 1 capsule (500 mg total) by mouth 4 (four) times daily.   CEPHALEXIN (KEFLEX) 500 MG CAPSULE    Take 1 capsule (500 mg total) by mouth 2 (two) times daily.    Note: Portions of this report may have been transcribed using voice recognition software. Every effort was made to ensure accuracy; however, inadvertent computerized transcription errors may be present    Wynetta Emery, PA-C 07/08/13 1140

## 2013-07-08 NOTE — ED Provider Notes (Signed)
Medical screening examination/treatment/procedure(s) were performed by non-physician practitioner and as supervising physician I was immediately available for consultation/collaboration.  Jerrell Hart T Analeah Brame, MD 07/08/13 1756 

## 2013-07-10 LAB — URINE CULTURE

## 2013-07-12 NOTE — ED Notes (Signed)
+   urine Patient treated with Cephalexin-sensitive to same-chart appended per protocol MD. 

## 2013-11-16 IMAGING — CR DG CHEST 2V
2 series · 2 of 2 positions shown · non-contrast
Comparison: 11/16/2006

CLINICAL DATA: Cough.

CHEST - 2 VIEW

[w chest pa]
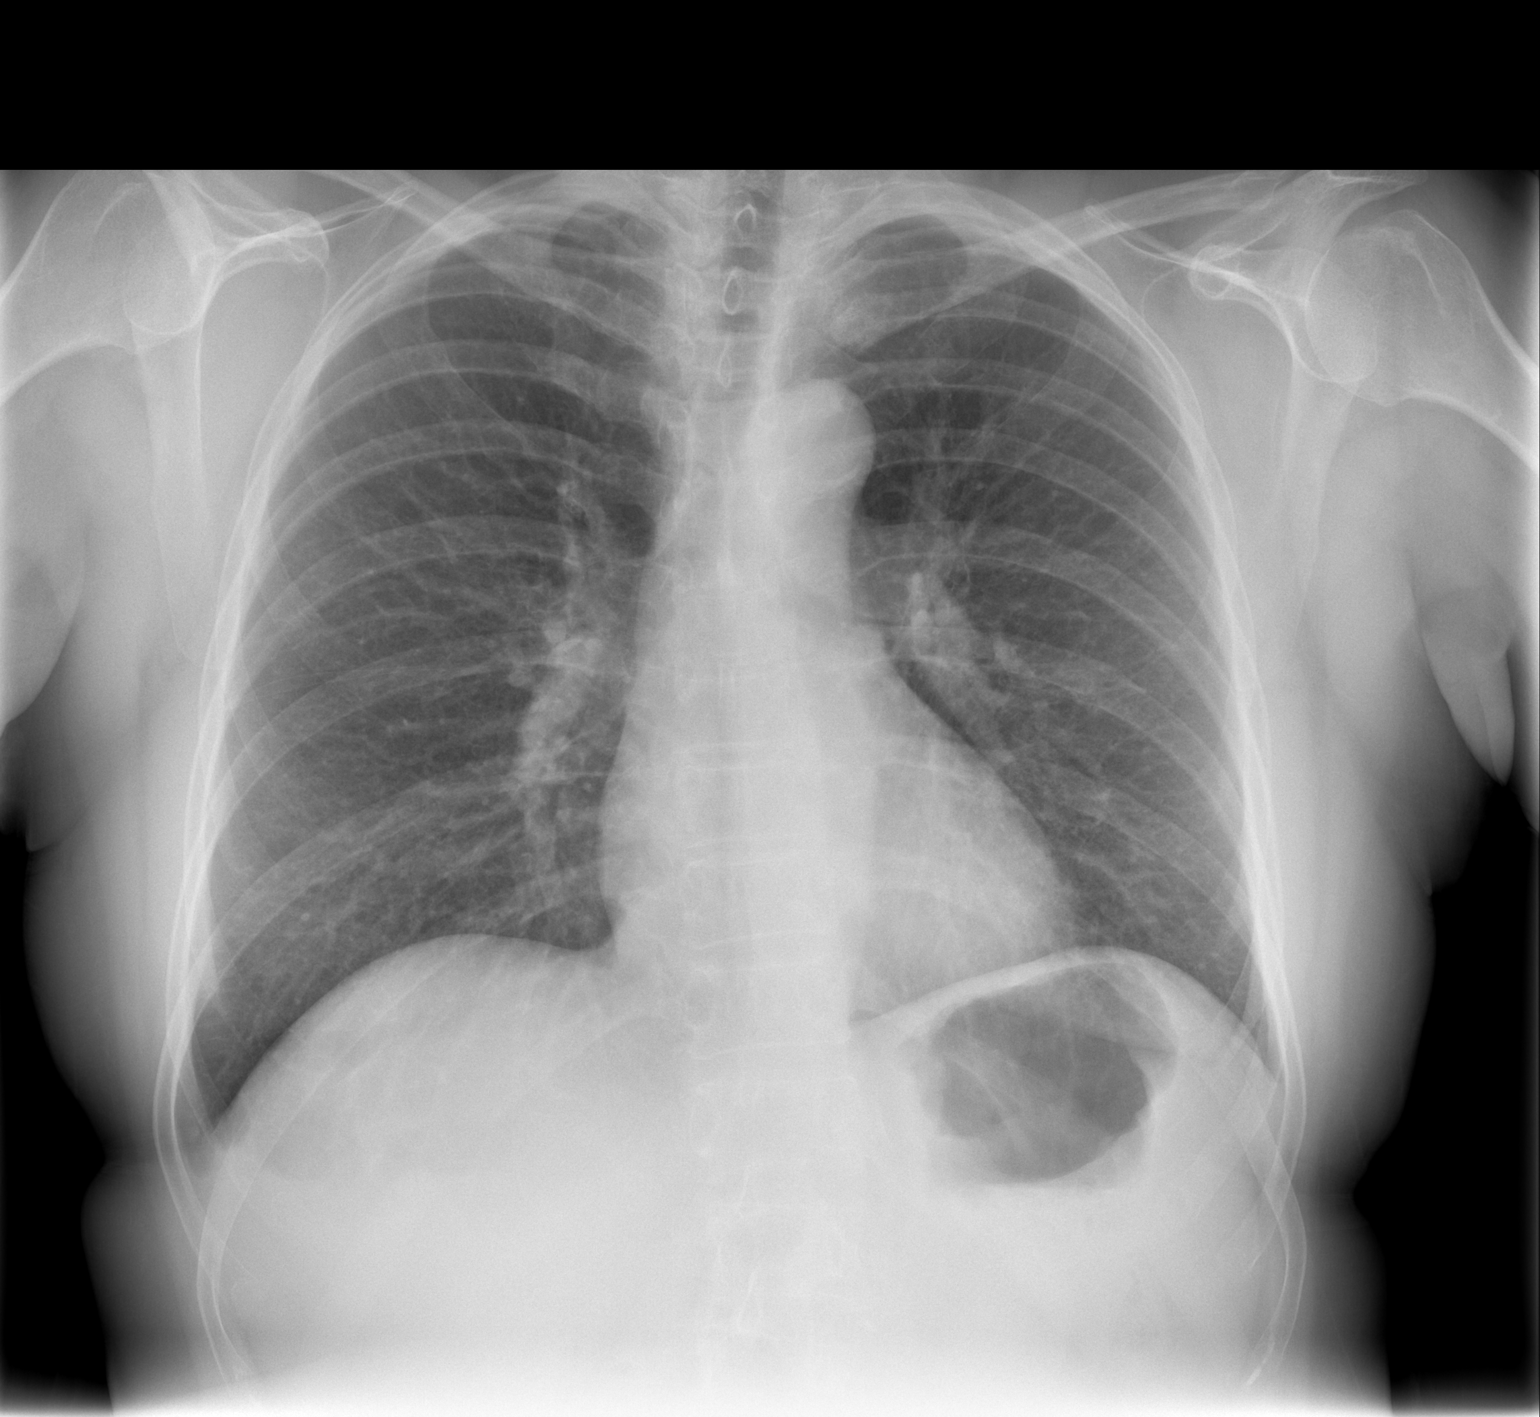

[w chest lat]
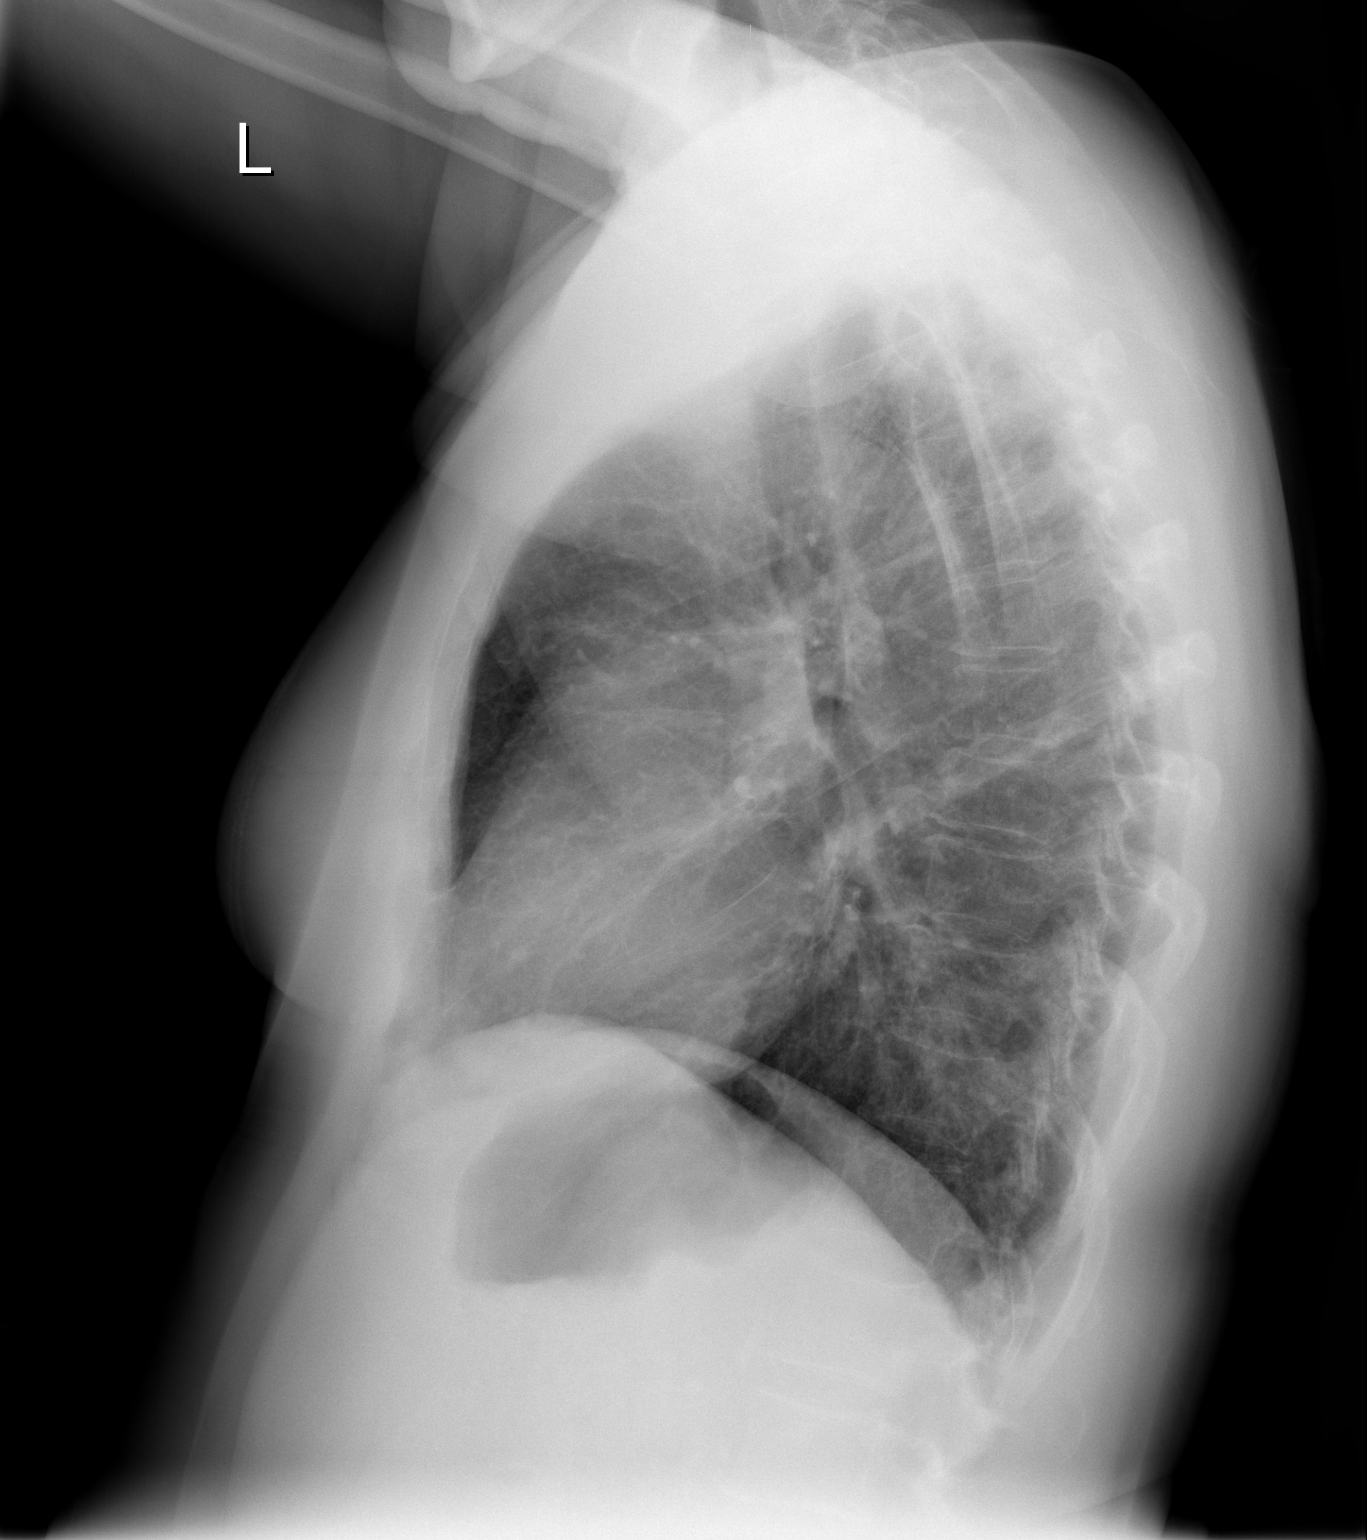

[2 of 2 positions shown; findings below may reference images not displayed]

FINDINGS: The cardiomediastinal silhouette is unremarkable.
Mild peribronchial thickening is unchanged.
There is no evidence of focal airspace disease, pulmonary edema,
suspicious pulmonary nodule/mass, pleural effusion, or
pneumothorax.
No acute bony abnormalities are identified.
IMPRESSION: No evidence of acute cardiopulmonary disease.

## 2014-02-03 ENCOUNTER — Encounter (HOSPITAL_COMMUNITY): Payer: Self-pay | Admitting: Emergency Medicine

## 2014-02-03 ENCOUNTER — Emergency Department (HOSPITAL_COMMUNITY)
Admission: EM | Admit: 2014-02-03 | Discharge: 2014-02-03 | Disposition: A | Discharge status: Home / Self Care | Payer: Self-pay | Attending: Emergency Medicine | Admitting: Emergency Medicine

## 2014-02-03 DIAGNOSIS — M549 Dorsalgia, unspecified: Secondary | ICD-10-CM

## 2014-02-03 DIAGNOSIS — N12 Tubulo-interstitial nephritis, not specified as acute or chronic: Secondary | ICD-10-CM | POA: Insufficient documentation

## 2014-02-03 DIAGNOSIS — Z8619 Personal history of other infectious and parasitic diseases: Secondary | ICD-10-CM | POA: Insufficient documentation

## 2014-02-03 DIAGNOSIS — F17200 Nicotine dependence, unspecified, uncomplicated: Secondary | ICD-10-CM | POA: Insufficient documentation

## 2014-02-03 DIAGNOSIS — Z3202 Encounter for pregnancy test, result negative: Secondary | ICD-10-CM | POA: Insufficient documentation

## 2014-02-03 LAB — URINALYSIS, ROUTINE W REFLEX MICROSCOPIC
Bilirubin Urine: NEGATIVE
GLUCOSE, UA: NEGATIVE mg/dL
HGB URINE DIPSTICK: NEGATIVE
Ketones, ur: NEGATIVE mg/dL
Nitrite: NEGATIVE
PH: 5.5 (ref 5.0–8.0)
Protein, ur: NEGATIVE mg/dL
SPECIFIC GRAVITY, URINE: 1.009 (ref 1.005–1.030)
UROBILINOGEN UA: 0.2 mg/dL (ref 0.0–1.0)

## 2014-02-03 LAB — URINE MICROSCOPIC-ADD ON

## 2014-02-03 LAB — PREGNANCY, URINE: Preg Test, Ur: NEGATIVE

## 2014-02-03 MED ORDER — CEFTRIAXONE SODIUM 1 G IJ SOLR
1.0000 g | Freq: Once | INTRAMUSCULAR | Status: AC
  Administered 2014-02-03: 1 g via INTRAMUSCULAR
  Filled 2014-02-03: qty 10

## 2014-02-03 MED ORDER — CEFTRIAXONE SODIUM 250 MG IJ SOLR
250.0000 mg | Freq: Once | INTRAMUSCULAR | Status: DC
  Filled 2014-02-03: qty 250

## 2014-02-03 MED ORDER — HYDROCODONE-ACETAMINOPHEN 5-325 MG PO TABS: 1.0000 | ORAL_TABLET | ORAL | Status: AC | PRN

## 2014-02-03 MED ORDER — ONDANSETRON 4 MG PO TBDP
4.0000 mg | ORAL_TABLET | Freq: Once | ORAL | Status: AC
  Administered 2014-02-03: 4 mg via ORAL
  Filled 2014-02-03: qty 1

## 2014-02-03 MED ORDER — OXYCODONE-ACETAMINOPHEN 5-325 MG PO TABS
1.0000 | ORAL_TABLET | Freq: Once | ORAL | Status: AC
  Administered 2014-02-03: 1 via ORAL
  Filled 2014-02-03: qty 1

## 2014-02-03 MED ORDER — LIDOCAINE HCL (PF) 1 % IJ SOLN
INTRAMUSCULAR | Status: AC
  Administered 2014-02-03: 5 mL
  Filled 2014-02-03: qty 5

## 2014-02-03 MED ORDER — CIPROFLOXACIN HCL 500 MG PO TABS: 500.0000 mg | ORAL_TABLET | Freq: Two times a day (BID) | ORAL | Status: AC

## 2014-02-03 MED ORDER — IBUPROFEN 800 MG PO TABS: 800.0000 mg | ORAL_TABLET | Freq: Three times a day (TID) | ORAL | Status: AC

## 2014-02-03 NOTE — Discharge Instructions (Signed)
Call for a follow up appointment with a Family or Primary Care Provider for further evaluation of your low back pain and urinary symptoms. Return to the ED if Symptoms worsen.   Take medication as prescribed.  Drink plenty of fluids.   Emergency Department Resource Guide 1) Find a Doctor and Pay Out of Pocket Although you won't have to find out who is covered by your insurance plan, it is a good idea to ask around and get recommendations. You will then need to call the office and see if the doctor you have chosen will accept you as a new patient and what types of options they offer for patients who are self-pay. Some doctors offer discounts or will set up payment plans for their patients who do not have insurance, but you will need to ask so you aren't surprised when you get to your appointment.  2) Contact Your Local Health Department Not all health departments have doctors that can see patients for sick visits, but many do, so it is worth a call to see if yours does. If you don't know where your local health department is, you can check in your phone book. The CDC also has a tool to help you locate your state's health department, and many state websites also have listings of all of their local health departments.  3) Find a Walk-in Clinic If your illness is not likely to be very severe or complicated, you may want to try a walk in clinic. These are popping up all over the country in pharmacies, drugstores, and shopping centers. They're usually staffed by nurse practitioners or physician assistants that have been trained to treat common illnesses and complaints. They're usually fairly quick and inexpensive. However, if you have serious medical issues or chronic medical problems, these are probably not your best option.  No Primary Care Doctor: - Call Health Connect at  [phone] - they can help you locate a primary care doctor that  accepts your insurance, provides certain services,  etc. - Physician Referral Service- [phone]  Chronic Pain Problems: Organization         Address  Phone   Notes  [facility]  [phone] Patients need to be referred by their primary care doctor.   Medication Assistance: Organization         Address  Phone   Notes  [facility]ication [facility] [address] [place], [place] [address] [phone] --Must be a resident of [facility] -- Must have NO insurance coverage whatsoever (no Medicaid/ Medicare, etc.) -- The pt. MUST have a primary care doctor that directs their care regularly and follows them in the community   MedAssist  [phone]   [employer]  [phone]    Agencies that provide inexpensive medical care: Organization         Address  Phone   Notes  [facility]  [phone]   [facility]    [phone]   [facility] [address] [place], [place] [address] [phone]   [facility] [address], [place] [phone]   Planned Parenthood    [phone]   [facility]    [phone]   [facility]  [address], [place] Phone:  [phone], Fax:  [phone] Hours of Operation:  9 am - 6 pm, M-F.  Also accepts  Medicaid/Medicare and self-pay.  [facility] for [address], [address], [place] Phone: [phone], Fax: [phone]. Hours of Operation:  8:30 am - 5:30 pm, M-F.  Also accepts Medicaid and self-pay.  [facility] [place] [address], [place] Phone: [phone]   [place], [place], [place] [phone], Ext. [phone] Mondays & Thursdays: 7-9 AM.  First 15 patients are seen on a first come, first serve basis.    [place] Providers:  Organization         Address  Phone   Notes  [facility] [address], [place] [phone] Also accepts self-pay patients.  [facility] [address], [place]  [phone]   [place], [address], [place] [phone]   [facility] [address], [place] [phone]   Lucianne Lei [address], [place]   [phone] Only accepts [place] Access [place] patients after they have their name applied to their card.   Self-Pay (no insurance) in [facility]:  Organization         Address  Phone   Notes  Sickle Cell Patients, [facility] [place] [phone]   [facility] [place] [phone]   [facility] [place]  [place], [place], [place] [phone]   [facility]/Dr. Osei-Bonsu  [address], [place] or [address], [place] [phone] Phone number for both [place] and [place] locations is the same.  Urgent Medical and [facility] [address], [place] [phone]   [facility] [address], [place] or [address] [phone]   [facility] [address], [place] [phone], phone; [phone], fax Sees patients 1st and 3rd Saturday of every month.  Must not qualify for public or private insurance (i.e. Medicaid, Medicare, [place] Health Choice, Veterans' Benefits)  Household income should be no more than 200% of the poverty level The clinic cannot treat you if you are pregnant or think you are pregnant  Sexually transmitted diseases are not treated at the clinic.    Dental Care: Organization         Address  Phone  Notes  [facility] of [facility] [place] [phone] Accepts children up to  age 83 who are enrolled in [place] or [place]; pregnant women with a Medicaid card; and children who have applied for Medicaid or [place] Health Choice, but were declined, whose parents can pay a reduced fee at time of service.  [facility] Department of [facility]  [address], [place] [phone] Accepts children up to age 81 who are enrolled in [place] or [place]; pregnant women with a Medicaid card; and children who have applied for Medicaid or [place] Health Choice, but were declined, whose parents can pay a reduced fee at time of service.  [facility] Access PRO[facility]  [place] [phone] Patients are seen by appointment only. Walk-ins are not accepted. Joffre will see patients 27 years of age and older. Monday - Tuesday (8am-5pm) Most  Wednesdays (8:30-5pm) $30 per visit, cash only  [facility] Adult Dental Access PROGRAM  [address], [facility] [phone] Patients are seen by appointment only. Walk-ins are not accepted. Natalia will see patients 31 years of age and older. One Wednesday Evening (Monthly: Volunteer Based).  $30 per visit, cash only  [place]  [phone] for adults; Children under age 20, call Graduate Pediatric Dentistry at [phone]. Children aged 59-14, please call [phone] to request a pediatric application.  Dental services are provided in all areas of dental care including fillings, crowns and bridges, complete and partial dentures, implants, gum treatment, root canals, and extractions. Preventive care is also provided. Treatment is provided to both adults and children. Patients are selected via a lottery and there is often a waiting list.   [facility] [address], [place]  [phone] [email or web address]   [facility] [address] [place], [place] [phone], Ext. [phone] Second and Fourth Thursday of  each month, opens at 6:30 AM; Clinic ends at 9 AM.  Patients are seen on a first-come first-served basis, and a limited number are seen during each clinic.   [facility]  [address] Hillard Danker [place], [place] [phone]   Eligibility Requirements You must have lived in [place], [place], or [place] counties for at least the last three months.   You cannot be eligible for state or federal sponsored [employer], including [employer], [place], or [employer].   You generally cannot be eligible for healthcare insurance through your employer.    How to apply: Eligibility screenings are held every Tuesday and Wednesday afternoon from 1:00 pm until 4:00 pm. You do not need an appointment for the interview!  [facility] [address] [place], [place]   [place]  [place] Department  [place]  [phone]    Behavioral Health Resources in the Community: Intensive Outpatient Programs Organization         Address  Phone  Notes  [facility] [place]. [address], [place], [place] [phone]   [facility]patient [address], [place], [place]   ADS: Alcohol & Drug Svcs [address] [place], [place]   [place] [address]  [place], [place] or [phone]   Substance Abuse Resources Organization         Address  Phone  Notes  Alcohol and Drug Services  [phone]   [place]  [phone]   [facility] [place]   Chinita Pester  [phone]   Residential & Outpatient Substance Abuse Program  [phone]   Psychological Services Organization         Address  Phone  Notes  [facility] [place]  [place]  [phone]   [place] [address],  [place] or [phone]    Mobile Crisis Teams Organization         Address  Phone  Notes  [facility]atives, Mobile Crisis Care Unit  [phone]   [facility] Psychotherapeutic Services  [address] [place], [place]   [place] Levels [address] [place] [phone]    Self-Help/Support Groups Organization         Address  Phone  Notes  Mental Health Assoc. of [place] - variety of support groups  [place] Call for more information  Narcotics Anonymous (NA), [facility] [address], [employer] [place]  2 meetings at this location   Special educational needs teacher         Address  Phone  Notes  ASAP Residential Treatment [place],    [place]  [phone]   [facility]  [address], [place] [id number], [place], [place]   [place] [address] [place], [place] [phone] Admissions: 8am-3pm M-F  Incentives Substance [place] [address],    [place], [place] [phone]   [facility] [address] [place], [place], [place]   [facility] [address],  [place], [place]   [facility]ight Programs - Intensive Outpatient [address], [place], [place]   [facility] ([place].,  [place], [place] [phone] or [phone]   [facility] Services (RTS) [address], [place], [place] Accepts Medicaid  [facility] [place] [address],  [place] [phone] Substance Abuse/Addiction Treatment   [facility] Organization         Address  Phone  Notes  [facility]  [phone]   Domenic Schwab, PhD [address] Arlis Porta [place], [place]   [phone] or [phone]   [place], [place] [phone]     [facility] [address] [place], [place] [phone] Insurance/Medicaid/sponsorship through [facility] and Families [address], [address]                                    [place], [place] [phone] [place] [address], [place] [phone]    Dr. Adele Schilder  [phone]   [facility] Dept. 1) [address], [place] 2) [place] 3)  [place] [address], [place] [phone]  [phone]   [place] [phone] or [phone] (After Hours)

## 2014-02-03 NOTE — ED Notes (Signed)
She c/o urinary burning onset Friday evening, worse since. Took AZO and cranberry juice with no relief. She has had UTIs in the past and this feels the same. Her lower back is also hurting

## 2014-02-03 NOTE — ED Notes (Signed)
Pt. Stated,  Pain when urinating since Friday afternoon.

## 2014-02-03 NOTE — ED Provider Notes (Signed)
CSN: [id number]     Arrival date & time 02/03/14  1013 History   First MD Initiated Contact with Patient 02/03/14 1126     Chief Complaint  Patient presents with  . Dysuria     (Consider location/radiation/quality/duration/timing/severity/associated sxs/prior Treatment) HPI Comments: Alisha Bennett is a 58 y.o. female with a past medical history of Hepatitis C, tobacco abuse presenting the Emergency Department with a chief complaint of worsening dysuria for 3 days. The patient reports taking AZO and cranberry juice without resolution of symptoms.  She denies hematuria, abnormal vaginal discharge, vaginal bleeding. She denies fever or chills.  Reports nausea without emesis.  Reports non-bloody stool yesterday. She reports low back pain since yesterday. She reports "turning wrong", which caused current episode.  Reports discomfort is worse with movement.  Denies numbness or weakness in the lower extremities.  Reports similar discomfort in the past and has been evaluated multiple times and given muscle relaxer and percocet. LNMP: > 5 years ago.    Patient is a 58 y.o. female presenting with dysuria. The history is provided by the patient and medical records. No language interpreter was used.  Dysuria Associated symptoms: nausea   Associated symptoms: no abdominal pain, no fever, no flank pain, no vaginal discharge and no vomiting     Past Medical History  Diagnosis Date  . Hepatitis C    Past Surgical History  Procedure Laterality Date  . Tubal ligation     History reviewed. No pertinent family history. History  Substance Use Topics  . Smoking status: Current Every Day Smoker    Types: Cigarettes  . Smokeless tobacco: Not on file  . Alcohol Use: Yes     Comment: occ   OB History   Grav Para Term Preterm Abortions TAB SAB Ect Mult Living                 Review of Systems  Constitutional: Negative for fever and chills.  Gastrointestinal: Positive for nausea. Negative for  vomiting, abdominal pain, diarrhea, constipation and blood in stool.  Genitourinary: Positive for dysuria. Negative for hematuria, flank pain, vaginal bleeding and vaginal discharge.  Musculoskeletal: Positive for back pain.      Allergies  Review of patient's allergies indicates no known allergies.  Home Medications   Current Outpatient Rx  Name  Route  Sig  Dispense  Refill  . cyclobenzaprine (FLEXERIL) 10 MG tablet   Oral   Take 10 mg by mouth 3 (three) times daily as needed for muscle spasms.         Marland Kitchen oxyCODONE-acetaminophen (PERCOCET/ROXICET) 5-325 MG per tablet   Oral   Take by mouth every 4 (four) hours as needed for severe pain.         . traMADol (ULTRAM) 50 MG tablet   Oral   Take 1 tablet (50 mg total) by mouth every 6 (six) hours as needed for pain.   15 tablet   0    BP 122/86  Pulse 97  Temp(Src) 98 F (36.7 C) (Oral)  Resp 15  Ht 5\' 7"  (1.702 m)  Wt 182 lb 1.6 oz (82.6 kg)  BMI 28.51 kg/m2  SpO2 97% Physical Exam  Nursing note and vitals reviewed. Constitutional: She is oriented to person, place, and time. She appears well-developed and well-nourished. No distress.  HENT:  Head: Normocephalic and atraumatic.  Eyes: EOM are normal. Pupils are equal, round, and reactive to light. No scleral icterus.  Neck: Neck supple.  Cardiovascular: Normal  rate, regular rhythm and normal heart sounds.   No murmur heard. Pulmonary/Chest: Effort normal and breath sounds normal. She has no wheezes. She has no rales.  Abdominal: Soft. Bowel sounds are normal. She exhibits no mass. There is tenderness in the left upper quadrant. There is CVA tenderness. There is no rebound and no guarding.  Musculoskeletal: Normal range of motion. She exhibits no edema.  No midline C-spine, T-spine, or L-spine tenderness with no step-offs, crepitus, or deformities noted. Good ROM.  Moves all 4 extremities.  Neurological: She is alert and oriented to person, place, and time. No  sensory deficit. She exhibits normal muscle tone.  Moves all 4 extremities, good strength in lower extremities. No midline C-spine, T-spine, or L-spine tenderness with no step-offs, crepitus, or deformities noted.  Skin: Skin is warm and dry. No rash noted.  Psychiatric: She has a normal mood and affect. Her behavior is normal.    ED Course  Procedures (including critical care time) Labs Review Labs Reviewed  URINALYSIS, ROUTINE W REFLEX MICROSCOPIC - Abnormal; Notable for the following:    APPearance CLOUDY (*)    Leukocytes, UA LARGE (*)    All other components within normal limits  URINE MICROSCOPIC-ADD ON - Abnormal; Notable for the following:    Bacteria, UA FEW (*)    All other components within normal limits   Imaging Review No results found.   EKG Interpretation None      MDM   Final diagnoses:  Pyelonephritis  Back pain   Pt with urinary symptoms for 3 days. UA shows WBC and few bacteria, will treat for pyelonephritis out pt. Patient with low back pain for several months.   No loss of bowel or bladder control.  No concern for cauda equina.  No fever, night sweats, weight loss, h/o cancer.  RICE protocol and pain medicine indicated and discussed with patient. Discussed lab results and treatment plan with the patient. Return precautions given. Reports understanding and no other concerns at this time.  Patient is stable for discharge at this time.  Meds given in ED:  Medications  cefTRIAXone (ROCEPHIN) injection 1 g (not administered)  lidocaine (PF) (XYLOCAINE) 1 % injection (not administered)  ondansetron (ZOFRAN-ODT) disintegrating tablet 4 mg (4 mg Oral Given 02/03/14 1219)  oxyCODONE-acetaminophen (PERCOCET/ROXICET) 5-325 MG per tablet 1 tablet (1 tablet Oral Given 02/03/14 1219)    New Prescriptions   CIPROFLOXACIN (CIPRO) 500 MG TABLET    Take 1 tablet (500 mg total) by mouth 2 (two) times daily.   HYDROCODONE-ACETAMINOPHEN (NORCO/VICODIN) 5-325 MG PER TABLET     Take 1 tablet by mouth every 4 (four) hours as needed.   IBUPROFEN (ADVIL,MOTRIN) 800 MG TABLET    Take 1 tablet (800 mg total) by mouth 3 (three) times daily.         Clabe SealLauren M Eulene Pekar, PA-C 02/05/14 1209

## 2014-02-03 NOTE — ED Notes (Signed)
Given Lidocaine 2.1 ml  With Ceftriaxone 1 g

## 2014-02-06 NOTE — ED Provider Notes (Signed)
Medical screening examination/treatment/procedure(s) were performed by non-physician practitioner and as supervising physician I was immediately available for consultation/collaboration.   EKG Interpretation None        Averil Digman, MD 02/06/14 0707 

## 2018-04-11 ENCOUNTER — Other Ambulatory Visit: Admit: 2018-04-11 | Discharge: 2018-04-12

## 2018-04-11 DIAGNOSIS — B1920 Unspecified viral hepatitis C without hepatic coma: Principal | ICD-10-CM

## 2018-06-12 ENCOUNTER — Ambulatory Visit
Admit: 2018-06-12 | Discharge: 2018-06-12 | Attending: Physical Medicine & Rehabilitation | Primary: Physical Medicine & Rehabilitation

## 2018-06-12 ENCOUNTER — Ambulatory Visit: Admit: 2018-06-12 | Discharge: 2018-06-12 | Attending: Gastroenterology | Primary: Gastroenterology

## 2018-06-12 DIAGNOSIS — M5416 Radiculopathy, lumbar region: Secondary | ICD-10-CM

## 2018-06-12 DIAGNOSIS — B182 Chronic viral hepatitis C: Principal | ICD-10-CM

## 2018-06-12 DIAGNOSIS — M545 Low back pain: Principal | ICD-10-CM

## 2018-06-12 MED ORDER — METHYLPREDNISOLONE 4 MG TABLETS IN A DOSE PACK
PACK | 0 refills | 0 days | Status: CP
Start: 2018-06-12 — End: ?

## 2018-06-12 NOTE — Unmapped (Addendum)
[facility]  FAST ??? Fibrosis Assessment Team  Division of Gastroenterology and Hepatology  ??  ??    ??  FIBROSCAN will be performed to assess hepatic fibrosis (scarring) in order to stage this patient's liver disease. This will assist with evaluating the natural course of the disease and will provide important information regarding prognosis, duration of therapy, and potential response to treatment. This information will also help assess risk for hepatocellular carcinoma and need for liver cancer surveillance.??  ??  FibroscanProcedure:   After obtaining verbal consent, the patient was placed in a supine position. Physical characteristics and landmarks were assessed to establish appropriate mid-axillary intercostal space for probe placement. 50Hz  Shear Wave pulses were applied and the resulting Shear Wave and Propagation Speed detected with a 3.5 MHz ultrasonic signal, using the FibroScan probe.  Skin to liver capsule distance and liver parenchyma were accessed during the entire examination with the FibroScan probe. The patient was instructed to breathe normally and to abstain from sudden movements during the procedure, allowing for random measurements of liver stiffness. At least ten Shear Waves were produced; individual measurements of each Shear Wave were calculated. Patient tolerated the procedure well and was discharged without incident.  ??  Probe used [x]  M+    Serial # [id number]                         []  XL+   Serial # [id number]  ??  Main Etiology of Liver Disease:  [x] HCV     [] HBV   [] Alcohol    [] NASH  [] PBC      [] PSC     [] Other________________  ??  50Hz  shear wave pulses were applied and the resulting shear wave and propagation speed detected with a 3.5 MHz ultrasonic signal, using the FibroScan probe.     ??  At least ten Shear Waves were produced; individual measurements of each shear wave were calculated.    ??  Patient tolerated the procedure well and was discharged without incident.  ??  Fibroscan score: ___27.7___kPa  ??  IQR:                       ___13___%  ??  Test performed by: Luiz Ochoa, RN  ??  [facility]  FAST ??? Fibrosis Assessment Team  Division of Gastroenterology and Hepatology  ??  ??    ??  ??  ??  Estimation of the stage of liver fibrosis (Metavir Score):  The results of the Liver Stiffness Score are consistent with the following liver fibrosis stage:  ??  ??  []  F0-F1             []  F2               []  F3               []  F4  ??  ??  GENERAL RECOMMENDATIONS ACCORDING TO THE STAGE OF LIVER FIBROSIS.  ??  F0-F1: No-minimal fibrosis. The risk of progression to advanced fibrosis and cirrhosis is low. If the cause of liver disease is not removed, a 1-2 yr follow-up study is recommended.   F2: Significant fibrosis. There is a moderate risk of progression to cirrhosis. If the cause of liver disease is not removed, a follow-up study in 12 months is recommended.  F3: Advanced (pre-cirrhotic stage). The risk of progression to cirrhosis is high. Imaging studies to rule out hepatocellular carcinoma  should be considered. Efforts to remove the cause of liver disease are highly recommended.  F4: Cirrhosis. There is significant risk of portal hypertension and esophageal varices. An upper endoscopy is recommended. Imaging studies for hepatocellular carcinoma screening are recommended.   ??  Any and all FibroScan studies must be carefully evaluated, taking fully into account all individual measurement/scans, patient history and other factors.  As with liver biopsy, any estimation of liver fibrosis may be subject to under or over staging due to sampling error.  Any further medical or surgical intervention should be made only while fully considering the circumstances of this patient and in consultation with this patient.    I have reviewed and interpreted the FIBROSCAN test results as described above.

## 2018-06-12 NOTE — Unmapped (Signed)
[facility] Physical Medicine and Rehabilitation     Patient Name:Courtney Lewis  MRN: [id number]  DOB: 1956-05-25  Age: 62 y.o.     ASSESSMENT & PLAN:     06/12/2018     DIAGNOSIS: lumbar radiculopathy  - low back pain with radiation down bilateral legs  - no myelopathy  - no motor weakness  - no red flag signs  TREATMENT PLAN:   - Referral to physical therapy provided.  - Prescription for medrol dosepack provided.  NEXT STEPS/FOLLOW UP: Please contact me after 6 to 8 sessions of physical therapy to discuss how you are progressing. We may discuss the possibility of a steroid injection as another avenue to address your pain in the future.     Comprehensive patient education performed today explaining that the care plan will integrate multimodal activity-based rehabilitation techniques to enhance recovery, reduce pain, and facilitate functionality. Risks, benefits, and instructions for all medications prescribed / treatments offered  reviewed extensively with patient, who expresses understanding.  Patient strongly advised regarding red flag signs such as new or progressive motor weakness, sensory deficits, saddle anesthesia, bowel/bladder dysfunction, gait/coordination disturbance, weight loss, and night pain that should prompt evaluation at nearest ER.    A consultation report has been transmitted to the consult requesting physician.    SUBJECTIVE:     Chief Complaint:    Back Pain; Hip Pain; and Leg Pain    History of Present Illness:   Courtney Lewis is a 62 y.o. year old female being evaluated in consultation at the request of Humphrey Rolls, MD for Back Pain; Hip Pain; and Leg Pain    Patient complains of low back pain which began 1 week ago after lifting an object above her head. She also notes numbness in her bilateral feet. She hasn't used any treatments for her pain.     Her low back pain is constant, with no alleviating or worsening factors noted, but it is worse in the morning upon waking. 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 Patient also notes history of hepatitis-C and liver disease.     Symptom Location: low back with radiation down bilateral legs  Symptom Character: numbness and soreness  Symptom Onset/Mechanism: chronic (>/= 3 months)  Temporal Pattern: constant and getting worse  NRS Pain Intensity: 8  Aggravating Factors: sitting, stairs, lying down, job duties (Therapist, music)  Alleviating Factors: none  Night Pain: no  Unintended Weight Loss: no  Fever/Infection:no  Neuromotor Function: motor weakness - (no),  gait/coordination disturbance - (no), loss of bowel or bladder control - (no), saddle anesthesia - (no)       Current Outpatient Medications   Medication Sig Dispense Refill   ??? cyclobenzaprine (FLEXERIL) 10 MG tablet Take 10 mg by mouth.     ??? ibuprofen (ADVIL,MOTRIN) 400 MG tablet Take 800 mg by mouth 2 (two) times a day with meals.     ??? oxyCODONE (ROXICODONE) 5 MG immediate release tablet Take 1 tablet (5 mg total) by mouth every six (6) hours as needed for pain. (Patient not taking: Reported on 06/12/2018) 8 tablet 0   ??? sulfamethoxazole-trimethoprim (BACTRIM DS) 800-160 mg per tablet Take 1 tablet by mouth Two (2) times a day.     ??? tiZANidine (ZANAFLEX) 2 MG tablet Take 1-2 tablets (2-4 mg total) by mouth every six (6) hours as needed. (Patient not taking: Reported on 06/12/2018) 24 tablet 0     No current facility-administered medications for this  visit.        Allergies:   Patient has no known allergies.    Past Medical / Surgical History:     Past Medical History:   Diagnosis Date   ??? Chronic hepatitis C (CMS-HCC)    ??? Chronic recurrent streptococcal erysipelas    ??? COPD (chronic obstructive pulmonary disease) (CMS-HCC)    ??? DJD (degenerative joint disease)      There are no active problems to display for this patient.    Past Surgical History:   Procedure Laterality Date   ??? TUBAL LIGATION Bilateral        Social History     Socioeconomic History   ??? Marital status: Single     Spouse name: Not on file   ??? Number of children: Not on file   ??? Years of education: Not on file   ??? Highest education level: Not on file   Occupational History   ??? Not on file   Social Needs   ??? Financial resource strain: Not on file   ??? Food insecurity:     Worry: Not on file     Inability: Not on file   ??? Transportation needs:     Medical: Not on file     Non-medical: Not on file   Tobacco Use   ??? Smoking status: Current Every Day Smoker     Packs/day: 0.50     Types: Cigarettes   ??? Smokeless tobacco: Never Used   Substance and Sexual Activity   ??? Alcohol use: Yes     Frequency: 2-4 times a month     Drinks per session: 1 or 2     Binge frequency: Less than monthly     Comment: very rarely   ??? Drug use: Yes     Types: Marijuana     Comment: last used a few days ago; prior use of crack cocaine   ??? Sexual activity: Not on file   Lifestyle   ??? Physical activity:     Days per week: Not on file     Minutes per session: Not on file   ??? Stress: Not on file   Relationships   ??? Social connections:     Talks on phone: Not on file     Gets together: Not on file     Attends religious service: Not on file     Active member of club or organization: Not on file     Attends meetings of clubs or organizations: Not on file     Relationship status: Not on file   Other Topics Concern   ??? Not on file   Social History Narrative   ??? Not on file       No family history on file.    For any of the above entries which indicate no records on file, the patient reports no relevant history.                 Review of Systems:   Review of Systems was completed through a 10 organ system review and is listed in the chart.  Pertinent positives are noted in HPI or flowsheet and otherwise negative.  Patient has been instructed to followup with PCP or appropriate specialist for symptoms outside the purview of this speciality.    OBJECTIVE:     Vitals:     BP 140/83  - Pulse 79  - Temp 36.7 ??C (98 ??F)  - Ht 170.2 cm (5'  7.01)  - Wt 75.4 kg (166 lb 3.2 oz)  - BMI 26.02 kg/m??     The above medications, allergies, history, and ROS, and vitals have been reviewed.    Physical Exam:   GEN: alert and oriented, no apparent distress  HEENT: normocephalic, atraumatic, anicteric, moist mucous membranes  CV: normal heart rate  PULM: normal work of breathing  EXT: no swelling, edema in b/l UE and LE  SKIN: no visible ecchymosis or breakdown  PSYCH: normal mood and affect    NEURO:   Motor   LLE 5/5 hip flexor, 5/5 knee extensor, 5/5 ankle dorsiflexor, 5/5 long toe extensor, 5/5 ankle plantar flexor  RLE 5/5 hip flexor, 5/5 knee extensor, 5/5 ankle dorsiflexor, 5/5 long toe extensor, 5/5 ankle plantar flexor    Sensory  Sensation to light touch WNL throughout b/l UE and LE    Reflexes  LLE: 2+ patella, 2+ Achilles  RLE: 2+ patella, 2+ Achilles   Hoffman's: - (negative) LUE, - (negative) RUE   Babinski: - (negative) LLE, - (negative) RLE   Clonus: - (negative) LLE, - (negative) RLE    Special Tests:   Straight Leg Raise: - (negative) LLE, - (negative) RLE   Spurling's: - (negative) LUE, - (negative) RUE    MSK:  Gait and Station: normal nonantalgic gait with symmetric body posture    Lumbar Spine:  Inspection: Normal alignment. No erythema, discoloration, or asymmetry.  Palpation: Paraspinal muscle tenderness.  No vertebral body point tenderness.  ROM: normal flexion, rotation, lateral bend. Pain with extension.   Pain with extension and rotation: - (negative) left, - (negative) right      __________________________________________________    Documentation assistance was provided by Alferd Patee, Scribe, on June 12, 2018 at 3:05 PM for Sheffield Slider, MD.     ----------------------------------------------------------------------------------------------------------------------  June 15, 2018 9:17 AM. Documentation assistance provided by the Scribe. I was present during the time the encounter was recorded. The information recorded by the Scribe was done at my direction and has been reviewed and validated by me.  ----------------------------------------------------------------------------------------------------------------------     Marijean Bravo Elby Showers) Glenice Laine, MD  Assistant Professor - PM&R  Assistant Professor - Department of Social Medicine  Interventional Spine Specialist - [facility] [place] [facility] [place]???[facility] - [facility] [employer]

## 2018-06-12 NOTE — Unmapped (Signed)
Counseling for HCV treatment     B18.2 Hep C: yes    K74.60 Cirrhosis: yes  Child Pugh Score if applicable and for Medicaid pts: pending labs  Z94.4 Liver Transplant: no    Genotype: 3 (5/28//19)  HCV RNA:  1,030,376 IU/ml on 04/11/18  Fibrosis score: F4 (27.7kPa) on Fibroscan on 06/12/18  HIV Co-infection? no  Signs of liver decompensation? no  Previous treatment? naive    Planned regimen: Mavyret (glecaprevir/pibrentasvir 100/40 mg) 3 tabs daily x 12 weeks  Urgency: Routine Request    Prescribing Provider/NPI: Dr. Chip Boer / [id number]  Please see Media tab under Chart Review on date 06/12/18 for patient signature/waiver forms.  Insurance: none, [employer] Patient Assistance Program (MPAP) application with patient.       Courtney Lewis is 62 y.o. Caucasian female who presents to clinic alone and is interested in starting treatment with Mavyret. We discussed the authorization process of obtaining the medication through MPAP and that this may take some time.  Stressed importance of being able to be reached by phone.       Current medications:  Current Outpatient Medications   Medication Sig Dispense Refill   ??? cyclobenzaprine (FLEXERIL) 10 MG tablet Take 10 mg by mouth.     ??? ibuprofen (ADVIL,MOTRIN) 400 MG tablet Take 800 mg by mouth 2 (two) times a day with meals.     OTC: MVI    Following topics were discussed during counseling:    1. Indications for medication, dosage and administration.     A. Mavyret (100/40 mg) 3 tablets to take once daily with food.    2. Common side effects of medications and management strategies. (fatigue, headache)    3. Importance of adherence to regimen, follow-up clinic visits and lab monitoring.     A.Asked patient to call Callao Kras [phone] before starting treatment to establish start date and to schedule TW#4 appointment.    4. Drug-drug interaction.    A. Current medications have been reviewed and assessed for possible interaction. Denies use of herbal medication such as milk thistle or St. John's wart.  Allergies have been verified. Endorses occasional alcohol. Reviewed effects of alcohol on HCV and recommended abstinence. Pt in agreement.    5. Importance of informing pharmacy and clinic of updated contact information.  Discussed the process of obtaining medication through specialty pharmacy and when approved medication will be delivered to patient's home. Stressed importance of being able to be reached over the phone.    Patient verbalized understanding. Provided contact information for any questions/concerns.       Park Breed, Pharm D., BCPS, BCGP, CPP  [facility]  [address]  [place], [place] [address]  [phone]    This portion of the visit was 15 minutes in duration and greater than 50% was spend in direct counseling and coordination of care regarding hepatitis C medication management.

## 2018-06-12 NOTE — Unmapped (Addendum)
Your fibroscan shows that your have cirrhosis, which is permanent scarring of your liver due to the chronic hepatitis C.  Therefore, it is vital that we treat your hepatitis C.  Please obtain labs to determine if you need the Hepatitis A or B vaccine.  Erskine Squibb will work with you to obtain the hepatitis C medications.  Please call the clinic to schedule your next appointment once you receive the medication.

## 2018-06-12 NOTE — Unmapped (Signed)
[facility] Ph [phone] Fax [phone]        Referring Provider:  Humphrey Rolls, MD  [address]  [place], [place] [address]     Primary Care Provider:  Humphrey Rolls, MD    Other Specialist(s):         PATIENT PROFILE:        Courtney Lewis is a 62 y.o. female (DOB: [birth year]) who is seen in consultation at the request of Dr. Royal Hawthorn for evaluation of active Hep C.       ASSESSMENT:      62 y.o. year old female with PMHx of COPD, DJD, erysipelas and history of polysubstance abuse who presents for evaluation of her active chronic Hep C.  Her Fibroscan completed today showed a median liver stiffness score of 27.7 consistent with cirrhosis, which is also supported by her low platelet count.  She is currently well compensated given her normal bilirubin and low-normal albumin and no overt evidence of cirrhosis on physical exam.  We discussed the natural history of HCV, risk factors for more aggressive fibrosis, transmission, and treatment. We discussed the oral therapies to treat HCV have a high cure rate and favorable side effect profile.  She is treatment naive and genotype 3, therefore will treat with Mavyret for 12 weeks.  Furthermore, she warrants screening for esophageal varices, evaluation of her BRBPR and routine CRC screening.  Her CT A/P in May 2019 did not show evidence of a focal lesion.  She will need continued HCC screening every 6 months.      PLAN:       -Fibroscan: 27.7   -Will check for Hep A and B immunity   -Meet with Park Breed, PharmD to discuss starting Mavyret for 12 weeks  -EGD and colonoscopy ordered  -q48m ultrasound (next due in Nov 2019)  -Advised patient to avoid alcohol and limit her marijuana use, as heavy daily use of marijuana can worsen liver function  -Return to clinic once patient has received medication    Patient was seen and discussed with Dr. Piedad Climes.    Courtney Hoehn, MD  Gastroenterology Fellow, PGY-4    Attending physician:  I have seen and examined this patient and discussed their management with the fellow and with the patient.  I agree with the recommendations outlined above. 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 HCV genotype 3, treatment naive. Recent HCC screen negative. Proceed with Mavyret X 12 weeks. Needs EGD/Colonoscopy (ordered) and TwinRix vaccination series.  [name], MD  [facility]          CHIEF COMPLAINT: HCV    HISTORY OF PRESENT ILLNESS: This is a 62 y.o. year old female with PMHx of COPD, DJD, erysipelas and history of polysubstance abuse who presents for evaluation of her chronic Hep C.  She was first diagnosed with Hep C in 2003 when she had routine blood work with her PCP.  She is unsure how she contracted Hep C, but does note multiple blood transfusions when she was 19 during the delivery of her daughter.  She endorses significant use of crack cocaine and marijuana but denies IVDU.  At that time of diagnosis, she was seen in [place] to discuss treatment options.   However, given that she was actively using crack cocaine and binge drinking, she was told that her life was too stressful to tolerate Hep C medication and that she may commit suicide.  She underwent a liver biopsy at that time and  she was told that her liver was in good shape.  She was offered inpatient rehab, but was unable to participate as she was the only caregiver for her daughter.  She was lost to follow up after that.  She reports that in the last 10 years, she has quit crack cocaine and now only drinks socially.  She continues to use marijuana daily.  She did not see a doctor until her low back pain became so severe that she presented to Dr. Royal Hawthorn about 3 months ago.  At that time, Dr. Royal Hawthorn rechecked her liver enzymes and noted that they were elevated to ALT 126, AST 134, alk phos 126, tbili 0.9, albumin 3.7 and plt 107.  These labs were again repeated on 03/24/18 which revealed ALT 160, AST 187, alk phos 159, tbili 0.6, albumin 3.9, creatinine 0.83. A CT A/P on 03/17/18 showed hepatic steatosis without a focal liver lesion and cholelithiasis. Labs from 04/13/18 were remarkable for a HCV RNA of >1 million IU/mL and an HCV genotype 3.  Today, she reports that she is motivated to treat her Hep C as she is the primary caregiver for her 38 year old granddaughter.    She denies jaundice, nausea, vomiting, constipation, hematemesis, abdominal pain or melena.  She does endorse recent diarrhea since starting bactrim and daily BRBPR for which she wears a pad.  She has not had a colonoscopy, but thinks that she has hemorrhoids.  She also endorses periodic pruritis.  She previously worked in a nursing home and thinks that she received the Hepatitis B vaccine course.      REVIEW OF SYSTEMS:     Review of 12 systems was negative except for Musculoskeletal:positive for back pain    PAST MEDICAL HISTORY:    Past Medical History:   Diagnosis Date   ??? Chronic hepatitis C (CMS-HCC)    ??? Chronic recurrent streptococcal erysipelas    ??? COPD (chronic obstructive pulmonary disease) (CMS-HCC)    ??? DJD (degenerative joint disease)        PAST SURGICAL HISTORY:    Past Surgical History:   Procedure Laterality Date   ??? TUBAL LIGATION Bilateral        MEDICATIONS:      Current Outpatient Medications:   ???  cyclobenzaprine (FLEXERIL) 10 MG tablet, Take 10 mg by mouth., Disp: , Rfl:   ???  ibuprofen (ADVIL,MOTRIN) 400 MG tablet, Take 800 mg by mouth 2 (two) times a day with meals., Disp: , Rfl:   ???  sulfamethoxazole-trimethoprim (BACTRIM DS) 800-160 mg per tablet, Take 1 tablet by mouth Two (2) times a day., Disp: , Rfl:   ???  oxyCODONE (ROXICODONE) 5 MG immediate release tablet, Take 1 tablet (5 mg total) by mouth every six (6) hours as needed for pain. (Patient not taking: Reported on 06/12/2018), Disp: 8 tablet, Rfl: 0  ???  tiZANidine (ZANAFLEX) 2 MG tablet, Take 1-2 tablets (2-4 mg total) by mouth every six (6) hours as needed. (Patient not taking: Reported on 06/12/2018), Disp: 24 tablet, Rfl: 0 ALLERGIES:    Patient has no known allergies.    SOCIAL HISTORY:    Social History     Socioeconomic History   ??? Marital status: Single     Spouse name: None   ??? Number of children: None   ??? Years of education: None   ??? Highest education level: None   Occupational History   ??? None   Social Needs   ??? Financial resource strain:  None   ??? Food insecurity:     Worry: None     Inability: None   ??? Transportation needs:     Medical: None     Non-medical: None   Tobacco Use   ??? Smoking status: Current Every Day Smoker     Packs/day: 0.50     Types: Cigarettes   ??? Smokeless tobacco: Never Used   Substance and Sexual Activity   ??? Alcohol use: Yes     Frequency: 2-4 times a month     Drinks per session: 1 or 2     Binge frequency: Less than monthly     Comment: very rarely   ??? Drug use: Yes     Types: Marijuana     Comment: last used a few days ago; prior use of crack cocaine   ??? Sexual activity: None   Lifestyle   ??? Physical activity:     Days per week: None     Minutes per session: None   ??? Stress: None   Relationships   ??? Social connections:     Talks on phone: None     Gets together: None     Attends religious service: None     Active member of club or organization: None     Attends meetings of clubs or organizations: None     Relationship status: None   Other Topics Concern   ??? None   Social History Narrative   ??? None       FAMILY HISTORY:    family history is not on file.      VITAL SIGNS:    BP 130/73 (BP Site: L Arm, BP Position: Sitting, BP Cuff Size: Large)  - Pulse 74  - Temp 36.5 ??C (Oral)  - Resp 18  - Ht 170.2 cm (5' 7.01)  - Wt 74.8 kg (165 lb)  - SpO2 99%  - Breastfeeding? No  - BMI 25.84 kg/m??   Body mass index is 25.84 kg/m??.    PHYSICAL EXAM:    Normal comprehensive exam:      Constitutional:   Alert, oriented x 3, no acute distress, well nourished   Mental Status:   Thought organized, appropriate affect, normal fluent speech.   HEENT:   PEERL, conjunctiva clear, anicteric, oropharynx clear, poor dentition, neck supple, no LAD.   Respiratory: Clear to auscultation, and percussion to the bases, unlabored breathing.     Cardiac: Regular rate and rhythm normal S1 and S2, no murmur.      Abdomen: Soft, non-distended, non-tender, no organomegaly or masses.     Perianal/Rectal Exam Not performed.     Extremities:   No edema, well perfused.   Musculoskeletal: No joint swelling or tenderness noted, no deformities.     Skin: Clean, dry, intact bandaids over UE erysipelas     Neuro: No focal deficits.          DIAGNOSTIC STUDIES:  I have reviewed all pertinent diagnostic studies, including:    GI Procedures:  Liver biopsy in 2003    Radiographic studies:  CT A/P 03/17/18:        Laboratory results:    No visits with results within 1 Week(s) from this visit.   Latest known visit with results is:   Lab on 04/11/2018   Component Date Value Ref Range Status   ??? HCV RNA (IU) 04/11/2018 9,528,413* <=0 IU/mL Final   ??? HCV RNA Log(10) 04/11/2018 6.01* <0.00 log IU/mL Final   ???  HCV RNA Comment 04/11/2018    Final                    Value:Hepatitis C virus RNA quantification is performed using the FDA-approved Abbott RealTime PCR HCV test, targeting the 5' UTR. This test can quantify HCV RNA over the range of 12-100,000,000 IU/mL (1.08 log(10) - 8.0 log(10) IU/mL). Both the limit of detection and limit of quantification are 12 IU/mL. The reference range for this assay is Not Detected.   ??? HCV Genotype 04/11/2018 3    Final     03/09/18:      03/24/18:    Results for orders placed or performed in visit on 06/12/18   Hepatitis B Core Antibody, total   Result Value Ref Range    Hep B Core Total Ab Nonreactive Nonreactive   Hepatitis B Surface Antibody   Result Value Ref Range    Hep B S Ab Nonreactive Nonreactive, [place]    Hepatitis B Surface Ab Quant <8.00 <8.00 m(IU)/mL   Hepatitis B Surface Antigen   Result Value Ref Range    Hepatitis B Surface Ag Nonreactive Nonreactive   Hepatitis A IgG   Result Value Ref Range    Hepatitis A IgG Nonreactive Nonreactive

## 2018-06-12 NOTE — Unmapped (Signed)
Dear Ms. Giuffre,    Thanks so much for coming to see Dr. Glenice Laine today. It was a pleasure to meet you. This summary reviews the goals and plans we discussed at your visit today.     Below, you will see: a) your working diagnosis b) your treatment plan c) your prognosis and d) your next steps and followup plan.    We care about your quality of life and are committed to helping optimize your functionality.     DIAGNOSIS: lumbar radiculopathy  - low back pain with radiation down bilateral legs  - no myelopathy  - no motor weakness  - no red flag signs  TREATMENT PLAN:   - Referral to physical therapy provided.  - Prescription for medrol dosepack provided.  NEXT STEPS/FOLLOW UP: Please contact me after 6 to 8 sessions of physical therapy to discuss how you are progressing. We may discuss the possibility of a steroid injection as another avenue to address your pain in the future.     You may contact me through MyChart or call our office with any questions.     If you develop any red flag signs such as new or progressive motor weakness, sensory deficits, saddle anesthesia (groin numbness), bowel/bladder dysfunction, gait/coordination disturbance, weight loss, or night pain, you should call our office and/or seek prompt evaluation at the nearest ER.    All my best,    Marijean Bravo Elby Showers) Glenice Laine, MD  Assistant Professor - PM&R  Assistant Professor - Department of Social Medicine  Interventional Spine Specialist - [facility] [place] [facility] [place] - [employer] of [employer]

## 2018-06-13 LAB — HEPATITIS A IGG: Hepatitis A virus Ab.IgG:PrThr:Pt:Ser:Ord:: NONREACTIVE

## 2018-06-13 LAB — HEPATITIS B SURFACE ANTIGEN: Hepatitis B virus surface Ag:PrThr:Pt:Ser:Ord:: NONREACTIVE

## 2018-06-13 LAB — HEPATITIS B CORE TOTAL ANTIBODY: Hepatitis B virus core Ab:PrThr:Pt:Ser/Plas:Ord:IA: NONREACTIVE

## 2018-06-13 LAB — HEPATITIS B SURFACE ANTIBODY QUANT: Hepatitis B virus surface Ab:ACnc:Pt:Ser:Qn:: <8

## 2018-06-16 MED ORDER — GLECAPREVIR 100 MG-PIBRENTASVIR 40 MG TABLET: 3 | tablet | 2 refills | 0 days

## 2018-06-16 MED ORDER — GLECAPREVIR 100 MG-PIBRENTASVIR 40 MG TABLET
ORAL_TABLET | Freq: Every day | ORAL | 2 refills | 0.00000 days | Status: CP
Start: 2018-06-16 — End: 2019-06-16

## 2018-06-19 NOTE — Unmapped (Signed)
Per test claim for MAVYRET 100-40MG  TABLET at the [facility], patient needs Medication Assistance Program for Manufacturer Assistance.

## 2018-07-06 NOTE — Unmapped (Signed)
Initial Counseling for HCV treatment     Planned regimen: Mavyret (glecaprevir/pibrentasvir 100/40 mg) 3 tabs daily x 12 weeks  Planned start date: TBD (Mavyret shipment scheduled for 07/11/18 delivery. Plans to start treatment 07/12/18)    Pharmacy: [facility] - [place] [phone]    PMH:   Past Medical History:   Diagnosis Date   ??? Chronic hepatitis C (CMS-HCC)    ??? Chronic recurrent streptococcal erysipelas    ??? COPD (chronic obstructive pulmonary disease) (CMS-HCC)    ??? DJD (degenerative joint disease)        Current Meds: Pt indicates she is not taking any prescription/OTC medications at this time    Patient is ready to start Mavyret.    Following topics were discussed during counseling:   Patient Counseling    Counseled the patient on the following:  doses and administration discussed, safe handling, storage, and disposal discussed, possible adverse effects and management discussed, possible drug and prescription drug interactions discussed, possible drug and OTC drug and food interactions discussed, lab monitoring and follow-up discussed, therapeutic rationale discussed, adherence and missed doses discussed, pharmacy contact information discussed        1. Indications for medication, dosage and administration.     A. Mavyret (100/40 mg) 3 tablets to take once daily with food. Patient plans to take in the mornings at 6:30am.  Pt notes that she wakes up every morning at 6am and regularly eats breakfast each morning.     2. Common side effects of medications and management strategies. (fatigue, headache) Pt notes she is currently regularly fatigued, and encouraged pt to keep an exercise regimen and to stay active, and that it is appropriate to take small breaks and rest as needed.  Also advised pt to avoid naps >1 hour as this could make her more fatigued.  Advised pt to stay hydrated and to ensure she is drinking plenty of water throughout the day, but if headaches occur she may take Tylenol as needed, but to ensure not to exceed 2,000mg /day (4 Tylenol Extra Strength tablets/day), and to avoid ibuprofen.     3. Importance of adherence to regimen, follow-up clinic visits and lab monitoring.   Medication Adherence    Specialty Medication:  Mavyret  Patient is on additional specialty medications:  No  Patient is on more than two specialty medications:  No  Demonstrates understanding of importance of adherence:  yes  Informant:  patient  Reasons for non-adherence:  no problems identified  Confirmed plan for next specialty medication refill:  outside pharmacy       A. Asked patient to call Carren Rang at [phone] to establish start date for treatment and to schedule appointment 4 weeks before starting treatment.      4. Drug-drug interaction. Pt indicates she is not taking any medications at this time, confirmed no OTC products/vitamins/herbal supplements.    Drug Interactions    Drug interactions evaluated:  yes  Clinically relevant drug interactions identified:  no  Provided the patient with educational material regarding drug interactions:  not applicable       A. Current medications have been reviewed and assessed for possible interaction.    Denies use of herbal medication such as milk thistle or St. John's wart.  Allergies have been verified. Pt says she has not consumed any alcohol recently and plans to stay abstinent during Mavyret treatment and afterwards to maintain proper liver care.  Endorsed pt for staying abstinent.      5. Importance  of informing pharmacy and clinic of updated contact information.  Stressed importance of being able to reach over the phone to set up refills. Advised patient to call pharmacy when down to about 7 day supply left to ensure there's no interruption in therapy.  Pt said she has scheduled her 1st Mavyret shipment with [facility] pharmacy to be delivered by 07/11/18, in which she plans on starting Mavyret treatment 07/12/18.  Advised pt to call [facility] if she has not received her shipment by 07/12/18.      Patient verbalized understanding. Provided contact information for any questions/concerns.       Casimer Bilis, PharmD Candidate  July 06, 2018 4:41 PM    Park Breed, Pharm D., BCPS, BCGP, CPP  [facility]  [address]  [place], [place] [address]  [phone]

## 2018-07-06 NOTE — Unmapped (Signed)
Patient has been approved to receive MAVYRET from the manufacturer from  07/05/18 until 12/17/18 and will be shipped directly to the patient's home.

## 2018-07-12 NOTE — Unmapped (Signed)
pt. called stating she had cracked her ribs and wanted to know what she could take for pain.   Provider recommended 2000mg  a day of Tylenol.  Pt. stated that was not controlling the pain.  Recommended pt. seek medical attention at a local ED, or PCP.  Pt. verbalized agreement with no further questions.

## 2018-07-26 NOTE — Unmapped (Signed)
Follow-Up Counseling for HCV Treatment      Regimen: Mavyret (glecaprevir/pibrentasvir 100/40 mg)  x 12 weeks  Start Date: 07/12/18  Completed Treatment Week #2    Pharmacy: [facility]    Following topics were reviewed during the phone call:  Patient Counseling    Counseled the patient on the following:  doses and administration discussed, possible adverse effects and management discussed, possible drug and prescription drug interactions discussed, possible drug and OTC drug and food interactions discussed, lab monitoring and follow-up discussed, therapeutic rationale discussed, adherence and missed doses discussed, pharmacy contact information discussed         1. Medication administration - Takes Mavyret 3 tabs every morning around 7:00am-7:30am with breakfast.    2. Importance of adherence -   Medication Adherence    Patient reported X missed doses in the last month:  0  Specialty Medication:  Mavyret  Patient is on additional specialty medications:  No  Patient is on more than two specialty medications:  No  Any gaps in refill history greater than 2 weeks in the last 3 months:  no  Demonstrates understanding of importance of adherence:  yes  Informant:  patient  Reliability of informant:  reliable  Provider-estimated medication adherence level:  90-100%  Patient is at risk for Non-Adherence:  No  Reasons for non-adherence:  no problems identified     Pt denies any missed doses.  Pill count over the phone revealed #13 day supply which is appropriate and indicates 100% adherence.  Pt says she has not needed to use an alarm to remind her to take her daily dose of Mavyret, and that even on weekends she has had no trouble taking her medication early with breakfast. Endorsed HCV therapy compliance, and stressed importance for continuing adherence.  Pt agreed and verbalized understanding.    3. Side effects -  Pt denies any fatigue/headaches, and notes she has been tolerating the regimen very well.  Adverse Effects    *All other systems reviewed and are negative         4. Drug-drug interaction - Denies any alcohol consumption. No new medications/OTC products/herbal supplements.  Drug Interactions    Drug interactions evaluated:  yes  Clinically relevant drug interactions identified:  no  Provided the patient with educational material regarding drug interactions:  not applicable       5. Follow up - Has follow up appointment scheduled in HCV treatment clinic on 08/15/18 with Owens Shark, DNP.  Advised patient to call pharmacy when down to about 7 day supply left to ensure there's no interruption in therapy.  Confirmed pt had [facility] contact information. Informed pt she may contact our clinic for any questions/concerns, and provided contact information.    All questions were answered.  Pt verbalized understanding.    Casimer Bilis, PharmD Candidate  July 26, 2018 12:37 PM    Park Breed, Pharm D., BCPS, BCGP, CPP  [facility]  [address]  [place], [place] [address]  [phone]

## 2018-08-15 ENCOUNTER — Ambulatory Visit: Admit: 2018-08-15 | Discharge: 2018-08-16 | Attending: Family | Primary: Family

## 2018-08-15 DIAGNOSIS — B182 Chronic viral hepatitis C: Principal | ICD-10-CM

## 2018-08-15 DIAGNOSIS — N3000 Acute cystitis without hematuria: Secondary | ICD-10-CM

## 2018-08-15 DIAGNOSIS — K760 Fatty (change of) liver, not elsewhere classified: Secondary | ICD-10-CM

## 2018-08-15 DIAGNOSIS — K7460 Unspecified cirrhosis of liver: Secondary | ICD-10-CM

## 2018-08-15 DIAGNOSIS — R8290 Unspecified abnormal findings in urine: Secondary | ICD-10-CM

## 2018-08-15 LAB — URINALYSIS
BILIRUBIN UA: NEGATIVE
BLOOD UA: NEGATIVE
GLUCOSE UA: NEGATIVE
NITRITE UA: POSITIVE — AB
PH UA: 6 (ref 5.0–9.0)
PROTEIN UA: NEGATIVE
RBC UA: 1 /HPF (ref <=4)
SPECIFIC GRAVITY UA: 1.018 (ref 1.003–1.030)
SQUAMOUS EPITHELIAL: <1 /HPF (ref 0–5)
UROBILINOGEN UA: 0.2
WBC UA: 35 /HPF — ABNORMAL HIGH (ref 0–5)

## 2018-08-15 LAB — BASIC METABOLIC PANEL
ANION GAP: 6 mmol/L — ABNORMAL LOW (ref 7–15)
BLOOD UREA NITROGEN: 14 mg/dL (ref 7–21)
BUN / CREAT RATIO: 24
CALCIUM: 8.5 mg/dL (ref 8.5–10.2)
CO2: 26 mmol/L (ref 22.0–30.0)
CREATININE: 0.59 mg/dL — ABNORMAL LOW (ref 0.60–1.00)
GLUCOSE RANDOM: 152 mg/dL (ref 65–179)
POTASSIUM: 4.3 mmol/L (ref 3.5–5.0)
SODIUM: 137 mmol/L (ref 135–145)

## 2018-08-15 LAB — RBC UA: Lab: 1

## 2018-08-15 LAB — CBC W/ AUTO DIFF
BASOPHILS ABSOLUTE COUNT: 0 10*9/L (ref 0.0–0.1)
BASOPHILS RELATIVE PERCENT: 0.5 %
EOSINOPHILS ABSOLUTE COUNT: 0.3 10*9/L (ref 0.0–0.4)
HEMATOCRIT: 44.2 % (ref 36.0–46.0)
HEMOGLOBIN: 14.4 g/dL (ref 12.0–16.0)
LARGE UNSTAINED CELLS: 2 % (ref 0–4)
LYMPHOCYTES RELATIVE PERCENT: 40.2 %
MEAN CORPUSCULAR HEMOGLOBIN CONC: 32.6 g/dL (ref 31.0–37.0)
MEAN CORPUSCULAR HEMOGLOBIN: 33.3 pg (ref 26.0–34.0)
MEAN CORPUSCULAR VOLUME: 102.2 fL — ABNORMAL HIGH (ref 80.0–100.0)
MEAN PLATELET VOLUME: 7.9 fL (ref 7.0–10.0)
MONOCYTES ABSOLUTE COUNT: 0.4 10*9/L (ref 0.2–0.8)
MONOCYTES RELATIVE PERCENT: 5.2 %
NEUTROPHILS ABSOLUTE COUNT: 3.8 10*9/L (ref 2.0–7.5)
NEUTROPHILS RELATIVE PERCENT: 48 %
PLATELET COUNT: 108 10*9/L — ABNORMAL LOW (ref 150–440)
RED BLOOD CELL COUNT: 4.32 10*12/L (ref 4.00–5.20)
RED CELL DISTRIBUTION WIDTH: 14.7 % (ref 12.0–15.0)
WBC ADJUSTED: 7.9 10*9/L (ref 4.5–11.0)

## 2018-08-15 LAB — WBC ADJUSTED: Lab: 7.9

## 2018-08-15 LAB — CHLORIDE: Chloride:SCnc:Pt:Ser/Plas:Qn:: 105

## 2018-08-15 LAB — HEPATIC FUNCTION PANEL
ALKALINE PHOSPHATASE: 130 U/L — ABNORMAL HIGH (ref 38–126)
ALT (SGPT): 45 U/L (ref 13–69)
BILIRUBIN TOTAL: 1.4 mg/dL — ABNORMAL HIGH (ref 0.0–1.2)

## 2018-08-15 LAB — ALKALINE PHOSPHATASE: Alkaline phosphatase:CCnc:Pt:Ser/Plas:Qn:: 130 — ABNORMAL HIGH

## 2018-08-15 NOTE — Unmapped (Addendum)
1.  Laboratory studies ordered today as part of continued part of hepatitis C care.   2.  Office follow up two months.   3.  No alcohol use.   4.  Increase water intake.   5.  Continue taking Mavyret three tablets daily with food. Avoid missing any doses.   6.  Abdominal ultrasound ordered for continued liver cancer screening. To be done on the same date of next office visit.   7.  Any questions or concerns please notify our office.   8.  First hepatitis A and B vaccine administered today.

## 2018-08-15 NOTE — Unmapped (Signed)
[facility] Ph [phone] Fax [phone]        Referring Provider:  Celine Mans  [address]  [address]  [place], [place] [address]     Primary Care Provider:  Humphrey Rolls, MD    Reason For Office Visit:  Treatment follow up HCV with well compensated cirrhosis. Treatment naive, genotype 3  Mavyret x 12 weeks  Start date: 07/12/2018  TW #5        HISTORY OF PRESENT ILLNESS:   This is a 62 y.o. year old female with PMHx of COPD, DJD, erysipelas and history of polysubstance abuse (crack cocaine) who presents today for treatment follow up ~ TW #5. She has adhered to taking three tablets of Mavyret daily with food. Denies any missed doses or alcohol use during treatment. Overall, she has tolerated medication very well. She has experienced some headaches as well as dizziness, but she was experiencing this symptoms prior to initiation of treatment. She has increased her water intake.     She presents alone today. She was first diagnosed with Hep C in 2003 when she had routine blood work with her PCP.  She is unsure how she contracted Hep C, but does note multiple blood transfusions when she was 19 during the delivery of her daughter. She endorses significant use of crack cocaine and marijuana but denies IVDU.  She has been clean from cocaine use for the past ten years. At that time of diagnosis, she was seen in [place] to discuss treatment options.  However, given that she was actively using crack cocaine and binge drinking, she was told that her life was too stressful to tolerate Hep C medication and that she may commit suicide.  She underwent a liver biopsy at that time and she was told that her liver was in good shape (minimal evidence of scarring).  She was offered inpatient rehab, but was unable to participate as she was the only caregiver for her daughter.  She was lost to follow up after that.  She reports over the past 10 years, she will only drink on social basis. She continues to use marijuana daily.  Complaint of chronic back pain. She did not see a doctor until her low back pain became so severe that she presented to Dr. Royal Hawthorn about 4 months ago.  At that time, Dr. Royal Hawthorn rechecked her liver enzymes and noted that they were elevated to ALT 126, AST 134, alk phos 126, tbili 0.9, albumin 3.7 and plt 107.  These labs were again repeated on 03/24/18 which revealed ALT 160, AST 187, alk phos 159, tbili 0.6, albumin 3.9, creatinine 0.83.  A CT A/P on 03/17/18 showed hepatic steatosis without a focal liver lesion and cholelithiasis. Labs from 04/13/18 were remarkable for a HCV RNA of >1 million IU/mL and an HCV genotype 3.      She denies jaundice, nausea, vomiting, constipation, hematemesis, abdominal pain or melena. Initially with treatment she experienced loose stools, change bowel habit pattern resolved at this time. She does experience on daily BRBPR for which she wears a pad. Pending having colonoscopy. She is fearful though she may not be able to have colonoscopy done as she does not have Charity Care at this time with [facility]. Pending getting paperwork straighten out.  She also endorses periodic pruritis.  She previously worked in a nursing home and thinks that she received the Hepatitis B vaccine course. Virology studies ordered last visit and does not indicate evidence of immunity.  She is currently self employed ~ Education administrator.     Complaint of foul smelling odor involving her urine prior to starting DAA treatment, seems to have improved with treatment up until recently when order noted again in association with her back pain becoming severe again.     Liver Section:   1. Chronic hepatitis C, treatment naive  Baseline HCV RNA: 1,030,376 IU/mL  HCV RNA TW #5- pending    2.  Immunity hepatitis A and B: #1 Twinrix vaccine administered 08/15/2018  3.  Fibroscan - F4 (27.7kPa) on Fibroscan on 06/12/18  4.  HCC screening:   CT A/P 03/17/2018        5.  Liver biopsy - 2003.  6.  Computed MELD-Na score unavailable. Necessary lab results were not found in the last year.  Computed MELD score unavailable. Necessary lab results were not found in the last year.  7.  Variceal Screening: No history of EGD     REVIEW OF SYSTEMS:   All systems reviewed and negative except in HPI.     PAST MEDICAL HISTORY:    Past Medical History:   Diagnosis Date   ??? Chronic hepatitis C (CMS-HCC)    ??? Chronic recurrent streptococcal erysipelas    ??? COPD (chronic obstructive pulmonary disease) (CMS-HCC)    ??? DJD (degenerative joint disease)    ??? Hepatitis C      PAST SURGICAL HISTORY:    Past Surgical History:   Procedure Laterality Date   ??? TUBAL LIGATION Bilateral      MEDICATIONS:  Current Outpatient Medications   Medication Sig Dispense Refill   ??? cyclobenzaprine (FLEXERIL) 10 MG tablet Take 10 mg by mouth.     ??? glecaprevir-pibrentasvir (MAVYRET) 100-40 mg tablet TAKE 3 TABLETS BY MOUTH DAILY WITH FOOD 84 tablet 2   ??? ibuprofen (ADVIL,MOTRIN) 400 MG tablet Take 800 mg by mouth 2 (two) times a day with meals.     ??? methylPREDNISolone (MEDROL DOSEPACK) 4 mg tablet follow package directions 1 Package 0   ??? oxyCODONE (ROXICODONE) 5 MG immediate release tablet Take 1 tablet (5 mg total) by mouth every six (6) hours as needed for pain. (Patient not taking: Reported on 06/12/2018) 8 tablet 0   ??? sulfamethoxazole-trimethoprim (BACTRIM DS) 800-160 mg per tablet Take 1 tablet by mouth Two (2) times a day.     ??? tiZANidine (ZANAFLEX) 2 MG tablet Take 1-2 tablets (2-4 mg total) by mouth every six (6) hours as needed. (Patient not taking: Reported on 06/12/2018) 24 tablet 0     No current facility-administered medications for this visit.      ALLERGIES:    Patient has no known allergies.    SOCIAL HISTORY:    Social History     Socioeconomic History   ??? Marital status: Single     Spouse name: None   ??? Number of children: None   ??? Years of education: None   ??? Highest education level: None   Occupational History   ??? None   Social Needs   ??? Financial resource strain: None   ??? Food insecurity:     Worry: None     Inability: None   ??? Transportation needs:     Medical: None     Non-medical: None   Tobacco Use   ??? Smoking status: Current Every Day Smoker     Packs/day: 0.35     Types: Cigarettes   ??? Smokeless tobacco: Never Used   Substance and Sexual  Activity   ??? Alcohol use: Not Currently     Frequency: 2-4 times a month     Drinks per session: 1 or 2     Binge frequency: Less than monthly     Comment: very rarely. No alcohol use since May 2019.    ??? Drug use: Yes     Types: Marijuana     Comment: MJ use daily.  No cocaine use x 10 years.    ??? Sexual activity: Not Currently     Partners: Male   Lifestyle   ??? Physical activity:     Days per week: None     Minutes per session: None   ??? Stress: None   Relationships   ??? Social connections:     Talks on phone: None     Gets together: None     Attends religious service: None     Active member of club or organization: None     Attends meetings of clubs or organizations: None     Relationship status: None   Other Topics Concern   ??? None   Social History Narrative   ??? None       FAMILY HISTORY:    family history is not on file.      VITAL SIGNS:    BP 147/76  - Pulse 69  - Temp 36.5 ??C (97.7 ??F) (Oral)  - Resp 18  - Ht 170.2 cm (5' 7.01)  - Wt 75.1 kg (165 lb 8 oz)  - SpO2 98%  - BMI 25.91 kg/m??   Body mass index is 25.91 kg/m??.    PHYSICAL EXAM:  General appearance - alert, well appearing, and in no distress, oriented to person, place, and time and normal appearing weight  Mental status - normal mood, behavior, speech, dress, motor activity, and thought processes. Tearful at one point with discussion of her granddaughter.   Neurological - screening mental status exam normal  Rest of PE deferred.     Laboratory results:  Results for orders placed or performed in visit on 08/15/18   Urinalysis   Result Value Ref Range    Color, UA Yellow     Clarity, UA Hazy     Specific Gravity, UA 1.018 1.003 - 1.030    pH, UA 6.0 5.0 - 9.0 Leukocyte Esterase, UA Moderate (A) Negative    Nitrite, UA Positive (A) Negative    Protein, UA Negative Negative    Glucose, UA Negative Negative    Ketones, UA Negative Negative    Urobilinogen, UA 0.2 mg/dL 0.2 mg/dL, 1.0 mg/dL    Bilirubin, UA Negative Negative    Blood, UA Negative Negative    RBC, UA 1 <=4 /HPF    WBC, UA 35 (H) 0 - 5 /HPF    Squam Epithel, UA <1 0 - 5 /HPF    Bacteria, UA Many (A) None Seen /HPF    Mucus, UA Occasional (A) None Seen /HPF   Basic metabolic panel   Result Value Ref Range    Sodium 137 135 - 145 mmol/L    Potassium 4.3 3.5 - 5.0 mmol/L    Chloride 105 98 - 107 mmol/L    CO2 26.0 22.0 - 30.0 mmol/L    Anion Gap 6 (L) 7 - 15 mmol/L    BUN 14 7 - 21 mg/dL    Creatinine 1.61 (L) 0.60 - 1.00 mg/dL    BUN/Creatinine Ratio 24     EGFR CKD-EPI Non-African American, Female >  90 >=60 mL/min/1.66m2    EGFR CKD-EPI African American, Female >90 >=60 mL/min/1.48m2    Glucose 152 65 - 179 mg/dL    Calcium 8.5 8.5 - 09.8 mg/dL   Hepatic Function Panel   Result Value Ref Range    Albumin 3.6 3.5 - 5.0 g/dL    Total Protein 7.1 6.5 - 8.3 g/dL    Total Bilirubin 1.4 (H) 0.0 - 1.2 mg/dL    Bilirubin, Direct 1.19 0.00 - 0.40 mg/dL    AST 57 (H) 17 - 47 U/L    ALT 45 13 - 69 U/L    Alkaline Phosphatase 130 (H) 38 - 126 U/L   Hepatitis C RNA, Quantitative, PCR   Result Value Ref Range    HCV RNA      HCV RNA (IU) 13 (H) <=0 IU/mL    HCV RNA Log(10) 1.11 (H) <0.00 log IU/mL    HCV RNA Comment       Hepatitis C virus RNA quantification is performed using the FDA-approved Abbott RealTime PCR HCV test, targeting the 5' UTR. This test can quantify HCV RNA over the range of 12-100,000,000 IU/mL (1.08 log(10) - 8.0 log(10) IU/mL). Both the limit of detection and limit of quantification are 12 IU/mL. The reference range for this assay is Not Detected.   CBC w/ Differential   Result Value Ref Range    WBC 7.9 4.5 - 11.0 10*9/L    RBC 4.32 4.00 - 5.20 10*12/L    HGB 14.4 12.0 - 16.0 g/dL    HCT 14.7 82.9 - 56.2 %    MCV 102.2 (H) 80.0 - 100.0 fL    MCH 33.3 26.0 - 34.0 pg    MCHC 32.6 31.0 - 37.0 g/dL    RDW 13.0 86.5 - 78.4 %    MPV 7.9 7.0 - 10.0 fL    Platelet 108 (L) 150 - 440 10*9/L    Neutrophils % 48.0 %    Lymphocytes % 40.2 %    Monocytes % 5.2 %    Eosinophils % 4.0 %    Basophils % 0.5 %    Absolute Neutrophils 3.8 2.0 - 7.5 10*9/L    Absolute Lymphocytes 3.2 1.5 - 5.0 10*9/L    Absolute Monocytes 0.4 0.2 - 0.8 10*9/L    Absolute Eosinophils 0.3 0.0 - 0.4 10*9/L    Absolute Basophils 0.0 0.0 - 0.1 10*9/L    Large Unstained Cells 2 0 - 4 %    Macrocytosis Moderate (A) Not Present       ASSESSMENT:      1.  Chronic hepatitis C, treatment naive, genotype 3:  Ms. Emami is a 62 y.o. year old Caucasian female with PMHx of COPD, DJD, erysipelas and history of polysubstance abuse who presents today for treatment follow up ~ TW #5. She has adhered to taking three tablets of Mavyret daily with food. Denies having missed any doses. No alcohol use since initiation of treatment. Overall she has tolerated medication very well. See History Section for details. Fibroscan showed a median liver stiffness score of 27.7 consistent with cirrhosis, which is also supported by her low platelet count. Contributing factors for cirrhosis ~ EtOH and HCV.      She is currently well compensated given her normal bilirubin and low-normal albumin and no overt evidence of cirrhosis on physical exam.  We readdressed the natural history of HCV, risk factors for more aggressive fibrosis, transmission, and treatment. Furthermore, she warrants screening for esophageal varices, evaluation  of her BRBPR and routine CRC screening. Orders have been placed (time of her last OV) for both EGD and colonoscopy. Pending findings.  Her CT A/P in May 2019 did not show evidence of a focal lesion.  She will need continued HCC screening every 6 months.      PLAN:       - HCV safety labs ordered.   - Immunity status hepatitis A and B: #1 Twinrix administered today. -HCV treatment: Continue Mavyret as prescribed. Three tablets once daily with food.   -EGD and colonoscopy ordered for indications as noted above. She is concerned she will not be able to have procedures done if she does not get approved for [facility]. She does have charitable coverage with [facility]. Instructed her to have her PCP to order both procedures if unable to have done with [facility]. Patient voiced understanding. She brought her [facility] paperwork with her today. Meet with our receptionist to try to help her with getting application turned in.   - [facility] screening: Abdominal ultrasound ordered for date of next visit.   -Avoid alcohol and MJ use.   -Office follow up two months for EOT visit.   -Declined flu vaccine today.     All patient's questions were answered to her satisfaction during office visit today.     Rodman Key, DNP, FNP-BC  [facility]  [address]  [place] [address]  Phone [phone]    Addendum: Patient was notified of her lab results, specifically UA result and result consistent with UTI. See Epic telephone message.     Rodman Key, DNP, FNP-BC

## 2018-08-16 LAB — HEPATITIS C RNA, QUANTITATIVE, PCR: HCV RNA LOG10: 1.11 {Log_IU}/mL — ABNORMAL HIGH (ref <0.00)

## 2018-08-16 LAB — HCV RNA: Hepatitis C virus RNA:PrThr:Pt:Ser/Plas:Ord:Probe.amp.tar: 0

## 2018-08-16 MED ORDER — CIPROFLOXACIN 500 MG TABLET
ORAL_TABLET | Freq: Two times a day (BID) | ORAL | 0 refills | 0.00000 days | Status: CP
Start: 2018-08-16 — End: ?

## 2018-08-17 NOTE — Unmapped (Signed)
Spoke with patient and reviewed UA results. Will proceed with RX for Cipro 500 mg twice daily x 5 days. No refills. RX sent to her local pharmacy. Advised patient if her symptoms do not improve to follow up with her PCP. Patient voiced understanding.

## 2018-09-23 NOTE — Unmapped (Signed)
Follow-Up Counseling for HCV Treatment      Regimen: Mavyret (glecaprevir/pibrentasvir 100/40 mg)  x 12 weeks  Start Date: 07/12/18  Completed Treatment Week #10    Pharmacy: [facility]    Following topics were reviewed during the phone call:  Patient Counseling    Counseled the patient on the following:  doses and administration discussed, possible adverse effects and management discussed, possible drug and prescription drug interactions discussed, possible drug and OTC drug and food interactions discussed, lab monitoring and follow-up discussed, therapeutic rationale discussed, adherence and missed doses discussed, pharmacy contact information discussed         1. Medication administration - Takes Mavyret 3 tabs with food every morning around 7am.    2. Importance of adherence -   Medication Adherence    Patient reported X missed doses in the last month:  2  Specialty Medication:  Mavyret  Patient is on additional specialty medications:  No  Patient is on more than two specialty medications:  No  Any gaps in refill history greater than 2 weeks in the last 3 months:  no  Demonstrates understanding of importance of adherence:  yes  Informant:  patient  Reliability of informant:  fairly reliable  Provider-estimated medication adherence level:  76-89%  Patient is at risk for Non-Adherence:  No  Reasons for non-adherence:  knowledge deficit      Pt reports she normally starts a new week of Mavyret treatment on a Wednesday, but recently noticed she was off by 2 days (starting on a Friday), indicating 2 missed doses.  However, she cannot recall when she missed either of the 2 doses. Pill count over the phone revealed #12 day supply which corresponds to patient's 2 missed doses.  Stressed the importance of adherence for the next 12 doses, pt says she normally takes Mavyret dose when she gets up each morning around 7am.  However, encouraged patient to use an alarm on her phone to remind her to take her Mavyret dose, and to mark off on a calendar that she has taken her daily dose for the remainder of her treatment.  Pt agreed and verbalized understanding.  Pt should end treatment on 10/05/18.    3. Side effects - Pt reports occasional headaches, in which patient will take Tylenol 500mg  2 tablets in the morning and 2 tablets in the evening.  Advised patient to not exceed 4 tablets/day of Tylenol 500mg  (2,000mg /day).  Encouraged patient to ensure she is drinking plenty of water throughout the day.  Pt verbalized understanding.  Adverse Effects        Headaches:  Pos         4. Drug-drug interaction - Pt reports no alcohol consumption, endorsed abstinent and encouraged patient to continue to do so.  However, patient says she has still been smoking marijuana, encouraged patient to limit/avoid use, pt verbalized understanding.  No new medications/OTC products/herbal supplements.   Drug Interactions    Drug interactions evaluated:  yes  Clinically relevant drug interactions identified:  no      Provided the patient with educational material regarding drug interactions:  not applicable           5. Follow up - Informed patient she does not have an EOT appointment yet, but a scheduler will give her a call next week.   Informed patient that there is still a small chance of relapse after finishing the treatment. Stressed importance of follow up 3 months post  treatment to assess for cure.      All questions were answered.  Pt verbalized understanding.    Casimer Bilis, PharmD Candidate  September 23, 2018 12:33 PM    I reviewed the findings with Pharmacy Student and agree with the findings and counseling as documented in the note.  Park Breed, Pharm D., BCPS, BCGP, CPP  [facility]  [address]  [place], [place] [address]  [phone]

## 2018-10-26 ENCOUNTER — Ambulatory Visit: Admit: 2018-10-26 | Discharge: 2018-10-26 | Attending: Family | Primary: Family

## 2018-10-26 DIAGNOSIS — K7460 Unspecified cirrhosis of liver: Secondary | ICD-10-CM

## 2018-10-26 DIAGNOSIS — B182 Chronic viral hepatitis C: Principal | ICD-10-CM

## 2018-10-26 DIAGNOSIS — R102 Pelvic and perineal pain: Secondary | ICD-10-CM

## 2018-10-26 DIAGNOSIS — Z13820 Encounter for screening for osteoporosis: Secondary | ICD-10-CM

## 2018-10-26 DIAGNOSIS — Z9229 Personal history of other drug therapy: Secondary | ICD-10-CM

## 2018-10-26 DIAGNOSIS — R5383 Other fatigue: Secondary | ICD-10-CM

## 2018-11-04 MED ORDER — ERGOCALCIFEROL (VITAMIN D2) 1,250 MCG (50,000 UNIT) CAPSULE
ORAL_CAPSULE | 2 refills | 0 days | Status: CP
Start: 2018-11-04 — End: ?

## 2018-11-14 ENCOUNTER — Ambulatory Visit: Admit: 2018-11-14 | Discharge: 2018-11-14

## 2018-11-14 DIAGNOSIS — R772 Abnormality of alphafetoprotein: Secondary | ICD-10-CM

## 2018-11-14 DIAGNOSIS — K7460 Unspecified cirrhosis of liver: Principal | ICD-10-CM

## 2018-11-14 DIAGNOSIS — B182 Chronic viral hepatitis C: Secondary | ICD-10-CM

## 2018-11-28 MED ORDER — PEG-ELECTROLYTE SOLUTION 420 GRAM ORAL SOLUTION
0 refills | 0 days | Status: CP
Start: 2018-11-28 — End: 2019-11-29

## 2018-12-29 ENCOUNTER — Ambulatory Visit: Admit: 2018-12-29 | Discharge: 2018-12-29

## 2018-12-29 ENCOUNTER — Encounter: Admit: 2018-12-29 | Discharge: 2018-12-29 | Attending: Anesthesiology | Primary: Anesthesiology

## 2018-12-29 DIAGNOSIS — K7460 Unspecified cirrhosis of liver: Principal | ICD-10-CM

## 2019-01-15 ENCOUNTER — Ambulatory Visit: Admit: 2019-01-15 | Discharge: 2019-01-16 | Attending: Family | Primary: Family

## 2019-01-15 DIAGNOSIS — K769 Liver disease, unspecified: Principal | ICD-10-CM

## 2019-01-15 DIAGNOSIS — E559 Vitamin D deficiency, unspecified: Principal | ICD-10-CM

## 2019-01-15 DIAGNOSIS — Z1283 Encounter for screening for malignant neoplasm of skin: Principal | ICD-10-CM

## 2019-01-15 DIAGNOSIS — K625 Hemorrhage of anus and rectum: Principal | ICD-10-CM

## 2019-01-15 DIAGNOSIS — I8500 Esophageal varices without bleeding: Principal | ICD-10-CM

## 2019-01-15 DIAGNOSIS — B182 Chronic viral hepatitis C: Principal | ICD-10-CM

## 2019-01-15 DIAGNOSIS — K3189 Other diseases of stomach and duodenum: Principal | ICD-10-CM

## 2019-01-15 DIAGNOSIS — Z23 Encounter for immunization: Principal | ICD-10-CM

## 2019-01-15 DIAGNOSIS — J449 Chronic obstructive pulmonary disease, unspecified: Principal | ICD-10-CM

## 2019-01-15 DIAGNOSIS — A46 Erysipelas: Principal | ICD-10-CM

## 2019-01-15 DIAGNOSIS — F1419 Cocaine abuse with unspecified cocaine-induced disorder: Principal | ICD-10-CM

## 2019-01-15 DIAGNOSIS — K7460 Unspecified cirrhosis of liver: Principal | ICD-10-CM

## 2019-01-15 DIAGNOSIS — M1990 Unspecified osteoarthritis, unspecified site: Principal | ICD-10-CM

## 2019-01-15 DIAGNOSIS — B1920 Unspecified viral hepatitis C without hepatic coma: Principal | ICD-10-CM

## 2019-01-15 DIAGNOSIS — Z9289 Personal history of other medical treatment: Principal | ICD-10-CM

## 2019-01-15 DIAGNOSIS — K648 Other hemorrhoids: Principal | ICD-10-CM

## 2019-01-15 DIAGNOSIS — Z8619 Personal history of other infectious and parasitic diseases: Principal | ICD-10-CM

## 2019-01-17 ENCOUNTER — Ambulatory Visit: Admit: 2019-01-17 | Discharge: 2019-01-18 | Attending: Dermatology | Primary: Dermatology

## 2019-01-17 DIAGNOSIS — J449 Chronic obstructive pulmonary disease, unspecified: Principal | ICD-10-CM

## 2019-01-17 DIAGNOSIS — Z9289 Personal history of other medical treatment: Principal | ICD-10-CM

## 2019-01-17 DIAGNOSIS — M1990 Unspecified osteoarthritis, unspecified site: Principal | ICD-10-CM

## 2019-01-17 DIAGNOSIS — B182 Chronic viral hepatitis C: Principal | ICD-10-CM

## 2019-01-17 DIAGNOSIS — B1920 Unspecified viral hepatitis C without hepatic coma: Principal | ICD-10-CM

## 2019-01-17 DIAGNOSIS — D485 Neoplasm of uncertain behavior of skin: Principal | ICD-10-CM

## 2019-01-17 DIAGNOSIS — Z1283 Encounter for screening for malignant neoplasm of skin: Principal | ICD-10-CM

## 2019-01-17 DIAGNOSIS — A46 Erysipelas: Principal | ICD-10-CM

## 2019-01-22 MED ORDER — CLOBETASOL 0.05 % TOPICAL OINTMENT
Freq: Two times a day (BID) | TOPICAL | 1 refills | 0.00000 days | Status: CP
Start: 2019-01-22 — End: 2020-01-22

## 2019-01-24 DIAGNOSIS — R87810 Cervical high risk human papillomavirus (HPV) DNA test positive: Principal | ICD-10-CM

## 2019-01-24 DIAGNOSIS — R748 Abnormal levels of other serum enzymes: Principal | ICD-10-CM

## 2019-01-24 DIAGNOSIS — R87610 Atypical squamous cells of undetermined significance on cytologic smear of cervix (ASC-US): Principal | ICD-10-CM

## 2019-01-31 DIAGNOSIS — K648 Other hemorrhoids: Principal | ICD-10-CM

## 2019-01-31 DIAGNOSIS — K625 Hemorrhage of anus and rectum: Principal | ICD-10-CM

## 2019-03-07 ENCOUNTER — Encounter: Admit: 2019-03-07 | Discharge: 2019-03-07 | Attending: Medical | Primary: Medical

## 2019-03-07 ENCOUNTER — Ambulatory Visit: Admit: 2019-03-07 | Discharge: 2019-03-08 | Attending: Medical | Primary: Medical

## 2019-03-07 DIAGNOSIS — N879 Dysplasia of cervix uteri, unspecified: Secondary | ICD-10-CM

## 2019-03-07 DIAGNOSIS — R87810 Cervical high risk human papillomavirus (HPV) DNA test positive: Secondary | ICD-10-CM

## 2019-03-07 DIAGNOSIS — R87610 Atypical squamous cells of undetermined significance on cytologic smear of cervix (ASC-US): Principal | ICD-10-CM

## 2019-05-28 ENCOUNTER — Ambulatory Visit: Admit: 2019-05-28 | Discharge: 2019-05-28

## 2019-05-28 DIAGNOSIS — B182 Chronic viral hepatitis C: Secondary | ICD-10-CM

## 2019-05-28 DIAGNOSIS — K769 Liver disease, unspecified: Secondary | ICD-10-CM

## 2019-05-28 DIAGNOSIS — K7460 Unspecified cirrhosis of liver: Principal | ICD-10-CM

## 2019-06-04 ENCOUNTER — Ambulatory Visit: Admit: 2019-06-04 | Discharge: 2019-06-05 | Attending: Internal Medicine | Primary: Internal Medicine

## 2019-06-04 DIAGNOSIS — K625 Hemorrhage of anus and rectum: Principal | ICD-10-CM

## 2019-06-04 DIAGNOSIS — K648 Other hemorrhoids: Secondary | ICD-10-CM

## 2019-07-31 ENCOUNTER — Institutional Professional Consult (permissible substitution): Admit: 2019-07-31 | Discharge: 2019-08-01 | Attending: Family | Primary: Family

## 2019-07-31 ENCOUNTER — Ambulatory Visit: Admit: 2019-07-31 | Discharge: 2019-08-01

## 2019-07-31 DIAGNOSIS — R5382 Chronic fatigue, unspecified: Secondary | ICD-10-CM

## 2019-07-31 DIAGNOSIS — M549 Dorsalgia, unspecified: Secondary | ICD-10-CM

## 2019-07-31 DIAGNOSIS — E559 Vitamin D deficiency, unspecified: Secondary | ICD-10-CM

## 2019-07-31 DIAGNOSIS — K769 Liver disease, unspecified: Secondary | ICD-10-CM

## 2019-07-31 DIAGNOSIS — K7460 Unspecified cirrhosis of liver: Secondary | ICD-10-CM

## 2019-08-23 ENCOUNTER — Ambulatory Visit
Admit: 2019-08-23 | Discharge: 2019-08-24 | Attending: Physical Medicine & Rehabilitation | Primary: Physical Medicine & Rehabilitation

## 2019-08-23 DIAGNOSIS — M47816 Spondylosis without myelopathy or radiculopathy, lumbar region: Secondary | ICD-10-CM

## 2019-08-24 ENCOUNTER — Ambulatory Visit: Admit: 2019-08-24 | Discharge: 2019-08-25

## 2019-08-24 DIAGNOSIS — M47816 Spondylosis without myelopathy or radiculopathy, lumbar region: Secondary | ICD-10-CM

## 2019-08-30 DIAGNOSIS — M47816 Spondylosis without myelopathy or radiculopathy, lumbar region: Principal | ICD-10-CM

## 2019-09-21 DIAGNOSIS — M47816 Spondylosis without myelopathy or radiculopathy, lumbar region: Principal | ICD-10-CM

## 2019-09-25 MED ORDER — METHYLPREDNISOLONE 4 MG TABLETS IN A DOSE PACK
PACK | 0 refills | 0 days | Status: CP
Start: 2019-09-25 — End: ?

## 2019-10-31 ENCOUNTER — Ambulatory Visit
Admit: 2019-10-31 | Discharge: 2019-11-29 | Payer: MEDICAID | Attending: Rehabilitative and Restorative Service Providers" | Primary: Rehabilitative and Restorative Service Providers"

## 2019-11-14 ENCOUNTER — Ambulatory Visit
Admit: 2019-11-14 | Discharge: 2019-11-14 | Disposition: A | Discharge status: Home / Self Care | Payer: MEDICAID | Attending: Family Medicine

## 2019-11-14 ENCOUNTER — Emergency Department
Admit: 2019-11-14 | Discharge: 2019-11-14 | Disposition: A | Discharge status: Home / Self Care | Payer: MEDICAID | Attending: Family Medicine

## 2019-11-14 DIAGNOSIS — J069 Acute upper respiratory infection, unspecified: Principal | ICD-10-CM

## 2019-11-14 DIAGNOSIS — R739 Hyperglycemia, unspecified: Principal | ICD-10-CM

## 2020-01-16 ENCOUNTER — Ambulatory Visit: Admit: 2020-01-16 | Discharge: 2020-01-17 | Attending: Internal Medicine | Primary: Internal Medicine

## 2020-01-30 DIAGNOSIS — K7460 Unspecified cirrhosis of liver: Principal | ICD-10-CM

## 2020-01-30 DIAGNOSIS — Z129 Encounter for screening for malignant neoplasm, site unspecified: Principal | ICD-10-CM

## 2020-02-05 ENCOUNTER — Ambulatory Visit: Admit: 2020-02-05 | Discharge: 2020-02-06 | Attending: Surgery | Primary: Surgery

## 2020-02-05 DIAGNOSIS — K644 Residual hemorrhoidal skin tags: Principal | ICD-10-CM

## 2020-02-05 DIAGNOSIS — K921 Melena: Principal | ICD-10-CM

## 2020-02-12 DIAGNOSIS — K921 Melena: Principal | ICD-10-CM

## 2020-02-22 ENCOUNTER — Encounter: Admit: 2020-02-22 | Discharge: 2020-02-22

## 2020-02-22 ENCOUNTER — Ambulatory Visit: Admit: 2020-02-22 | Discharge: 2020-02-23

## 2020-02-22 DIAGNOSIS — Z01818 Encounter for other preprocedural examination: Principal | ICD-10-CM

## 2020-02-25 ENCOUNTER — Encounter: Admit: 2020-02-25 | Discharge: 2020-02-25 | Attending: Anesthesiology | Primary: Anesthesiology

## 2020-02-25 ENCOUNTER — Ambulatory Visit: Admit: 2020-02-25 | Discharge: 2020-02-25

## 2020-02-25 MED ORDER — OXYCODONE 5 MG TABLET
ORAL_TABLET | Freq: Three times a day (TID) | ORAL | 0 refills | 7 days | Status: CP | PRN
Start: 2020-02-25 — End: 2020-03-03
  Filled 2020-02-25: qty 21, 7d supply, fill #0

## 2020-02-25 MED FILL — OXYCODONE 5 MG TABLET: 7 days supply | Qty: 21 | Fill #0 | Status: AC

## 2020-03-17 ENCOUNTER — Ambulatory Visit: Admit: 2020-03-17 | Discharge: 2020-03-17

## 2020-03-17 ENCOUNTER — Ambulatory Visit: Admit: 2020-03-17 | Discharge: 2020-03-17 | Attending: Family | Primary: Family

## 2020-03-17 DIAGNOSIS — R55 Syncope and collapse: Secondary | ICD-10-CM

## 2020-03-17 DIAGNOSIS — Z8619 Personal history of other infectious and parasitic diseases: Principal | ICD-10-CM

## 2020-03-17 DIAGNOSIS — Z122 Encounter for screening for malignant neoplasm of respiratory organs: Principal | ICD-10-CM

## 2020-03-17 DIAGNOSIS — Z13220 Encounter for screening for lipoid disorders: Principal | ICD-10-CM

## 2020-03-17 DIAGNOSIS — I700 Atherosclerosis of aorta: Principal | ICD-10-CM

## 2020-03-17 DIAGNOSIS — R42 Dizziness and giddiness: Principal | ICD-10-CM

## 2020-03-17 DIAGNOSIS — R7989 Other specified abnormal findings of blood chemistry: Principal | ICD-10-CM

## 2020-03-17 DIAGNOSIS — Z1321 Encounter for screening for nutritional disorder: Principal | ICD-10-CM

## 2020-03-17 DIAGNOSIS — K7460 Unspecified cirrhosis of liver: Principal | ICD-10-CM

## 2020-03-20 ENCOUNTER — Ambulatory Visit: Admit: 2020-03-20 | Discharge: 2020-03-21 | Attending: Surgery | Primary: Surgery

## 2020-03-22 MED ORDER — OXYCODONE 10 MG TABLET
ORAL_TABLET | ORAL | 0 refills | 2 days | Status: CP | PRN
Start: 2020-03-22 — End: 2020-03-27

## 2020-03-23 ENCOUNTER — Emergency Department
Admit: 2020-03-23 | Discharge: 2020-03-23 | Disposition: A | Discharge status: Home / Self Care | Payer: MEDICAID | Attending: Family Medicine

## 2020-03-23 ENCOUNTER — Ambulatory Visit
Admit: 2020-03-23 | Discharge: 2020-03-23 | Disposition: A | Discharge status: Home / Self Care | Payer: MEDICAID | Attending: Family Medicine

## 2020-03-23 MED ORDER — OXYCODONE-ACETAMINOPHEN 5 MG-325 MG TABLET
ORAL_TABLET | ORAL | 0 refills | 1 days | Status: CP | PRN
Start: 2020-03-23 — End: 2020-03-28

## 2020-03-25 ENCOUNTER — Ambulatory Visit: Admit: 2020-03-25 | Discharge: 2020-03-26 | Attending: Cardiovascular Disease | Primary: Cardiovascular Disease

## 2020-03-25 DIAGNOSIS — Z13820 Encounter for screening for osteoporosis: Principal | ICD-10-CM

## 2020-03-25 DIAGNOSIS — R42 Dizziness and giddiness: Principal | ICD-10-CM

## 2020-03-25 DIAGNOSIS — R55 Syncope and collapse: Principal | ICD-10-CM

## 2020-03-25 DIAGNOSIS — E559 Vitamin D deficiency, unspecified: Principal | ICD-10-CM

## 2020-03-25 DIAGNOSIS — I700 Atherosclerosis of aorta: Principal | ICD-10-CM

## 2020-03-25 MED ORDER — ERGOCALCIFEROL (VITAMIN D2) 1,250 MCG (50,000 UNIT) CAPSULE
ORAL_CAPSULE | ORAL | 2 refills | 0.00000 days | Status: CP
Start: 2020-03-25 — End: ?

## 2020-03-29 ENCOUNTER — Ambulatory Visit: Admit: 2020-03-29 | Discharge: 2020-03-30

## 2020-04-03 ENCOUNTER — Ambulatory Visit
Admit: 2020-04-03 | Discharge: 2020-04-04 | Attending: Physical Medicine & Rehabilitation | Primary: Physical Medicine & Rehabilitation

## 2020-04-03 DIAGNOSIS — M47816 Spondylosis without myelopathy or radiculopathy, lumbar region: Principal | ICD-10-CM

## 2020-04-03 DIAGNOSIS — S32010A Wedge compression fracture of first lumbar vertebra, initial encounter for closed fracture: Principal | ICD-10-CM

## 2020-04-03 DIAGNOSIS — M5416 Radiculopathy, lumbar region: Principal | ICD-10-CM

## 2020-04-03 MED ORDER — GABAPENTIN 300 MG CAPSULE
ORAL_CAPSULE | Freq: Three times a day (TID) | ORAL | 3 refills | 30.00000 days | Status: CP
Start: 2020-04-03 — End: 2020-05-03

## 2020-04-08 ENCOUNTER — Ambulatory Visit: Admit: 2020-04-08

## 2020-04-29 ENCOUNTER — Ambulatory Visit: Admit: 2020-04-29 | Discharge: 2020-04-30

## 2020-05-31 ENCOUNTER — Ambulatory Visit: Admit: 2020-05-31 | Discharge: 2020-06-01

## 2020-07-11 ENCOUNTER — Ambulatory Visit: Admit: 2020-07-11 | Discharge: 2020-07-12 | Attending: Cardiovascular Disease | Primary: Cardiovascular Disease

## 2020-07-11 DIAGNOSIS — R0602 Shortness of breath: Principal | ICD-10-CM

## 2020-07-11 DIAGNOSIS — I2510 Atherosclerotic heart disease of native coronary artery without angina pectoris: Principal | ICD-10-CM

## 2020-07-11 DIAGNOSIS — I2584 Coronary atherosclerosis due to calcified coronary lesion: Principal | ICD-10-CM

## 2020-07-24 ENCOUNTER — Ambulatory Visit: Admit: 2020-07-24 | Discharge: 2020-07-25

## 2020-12-29 MED ORDER — COVID-19 TEST SPECIMEN COLLECTION
0.00000 days
Start: 2020-12-29 — End: ?

## 2021-01-22 ENCOUNTER — Ambulatory Visit: Admit: 2021-01-22 | Discharge: 2021-01-22 | Attending: Family | Primary: Family

## 2021-01-22 ENCOUNTER — Ambulatory Visit: Admit: 2021-01-22 | Discharge: 2021-01-22

## 2021-01-22 DIAGNOSIS — R300 Dysuria: Principal | ICD-10-CM

## 2021-01-22 DIAGNOSIS — K7460 Unspecified cirrhosis of liver: Principal | ICD-10-CM

## 2021-01-22 DIAGNOSIS — Z1321 Encounter for screening for nutritional disorder: Principal | ICD-10-CM

## 2021-01-22 DIAGNOSIS — Z122 Encounter for screening for malignant neoplasm of respiratory organs: Principal | ICD-10-CM

## 2021-02-28 ENCOUNTER — Ambulatory Visit: Admit: 2021-02-28 | Discharge: 2021-03-01

## 2021-04-07 ENCOUNTER — Ambulatory Visit: Admit: 2021-04-07 | Discharge: 2021-04-08

## 2021-04-07 DIAGNOSIS — L281 Prurigo nodularis: Principal | ICD-10-CM

## 2021-04-13 ENCOUNTER — Ambulatory Visit: Admit: 2021-04-13 | Discharge: 2021-04-13 | Disposition: A | Discharge status: Home / Self Care | Payer: MEDICAID

## 2021-04-13 DIAGNOSIS — J069 Acute upper respiratory infection, unspecified: Principal | ICD-10-CM

## 2022-02-08 ENCOUNTER — Ambulatory Visit: Admit: 2022-02-08 | Discharge: 2022-02-08 | Disposition: A | Discharge status: Home / Self Care | Payer: MEDICARE

## 2022-02-08 ENCOUNTER — Emergency Department: Admit: 2022-02-08 | Discharge: 2022-02-08 | Disposition: A | Discharge status: Home / Self Care | Payer: MEDICARE

## 2022-02-08 DIAGNOSIS — S63501A Unspecified sprain of right wrist, initial encounter: Principal | ICD-10-CM

## 2022-02-08 DIAGNOSIS — S6991XA Unspecified injury of right wrist, hand and finger(s), initial encounter: Secondary | ICD-10-CM | POA: Diagnosis not present

## 2022-02-08 DIAGNOSIS — S6391XA Sprain of unspecified part of right wrist and hand, initial encounter: Secondary | ICD-10-CM | POA: Diagnosis not present

## 2022-02-08 DIAGNOSIS — M79641 Pain in right hand: Secondary | ICD-10-CM | POA: Diagnosis not present

## 2022-02-08 DIAGNOSIS — M25531 Pain in right wrist: Secondary | ICD-10-CM | POA: Diagnosis not present

## 2022-02-08 DIAGNOSIS — F17210 Nicotine dependence, cigarettes, uncomplicated: Secondary | ICD-10-CM | POA: Diagnosis not present

## 2022-03-01 DIAGNOSIS — M25562 Pain in left knee: Principal | ICD-10-CM

## 2022-03-01 DIAGNOSIS — M25561 Pain in right knee: Principal | ICD-10-CM

## 2022-03-01 DIAGNOSIS — R5383 Other fatigue: Secondary | ICD-10-CM | POA: Diagnosis not present

## 2022-03-01 DIAGNOSIS — Z1331 Encounter for screening for depression: Secondary | ICD-10-CM | POA: Diagnosis not present

## 2022-03-01 DIAGNOSIS — I700 Atherosclerosis of aorta: Secondary | ICD-10-CM | POA: Diagnosis not present

## 2022-03-01 DIAGNOSIS — L281 Prurigo nodularis: Secondary | ICD-10-CM | POA: Diagnosis not present

## 2022-03-01 DIAGNOSIS — K7460 Unspecified cirrhosis of liver: Secondary | ICD-10-CM | POA: Diagnosis not present

## 2022-03-01 DIAGNOSIS — Z9181 History of falling: Secondary | ICD-10-CM | POA: Diagnosis not present

## 2022-03-01 DIAGNOSIS — R7989 Other specified abnormal findings of blood chemistry: Secondary | ICD-10-CM | POA: Diagnosis not present

## 2022-03-01 DIAGNOSIS — R202 Paresthesia of skin: Secondary | ICD-10-CM | POA: Diagnosis not present

## 2022-03-02 DIAGNOSIS — Z87891 Personal history of nicotine dependence: Principal | ICD-10-CM

## 2022-03-12 ENCOUNTER — Ambulatory Visit: Admit: 2022-03-12 | Discharge: 2022-03-13 | Payer: MEDICARE

## 2022-03-12 DIAGNOSIS — M25561 Pain in right knee: Principal | ICD-10-CM

## 2022-03-12 DIAGNOSIS — M25562 Pain in left knee: Principal | ICD-10-CM

## 2022-03-12 DIAGNOSIS — M170 Bilateral primary osteoarthritis of knee: Secondary | ICD-10-CM | POA: Diagnosis not present

## 2022-03-12 DIAGNOSIS — Z87891 Personal history of nicotine dependence: Secondary | ICD-10-CM | POA: Diagnosis not present

## 2022-03-12 DIAGNOSIS — M11261 Other chondrocalcinosis, right knee: Secondary | ICD-10-CM | POA: Diagnosis not present

## 2022-03-12 DIAGNOSIS — K7460 Unspecified cirrhosis of liver: Secondary | ICD-10-CM | POA: Diagnosis not present

## 2022-03-12 DIAGNOSIS — I2510 Atherosclerotic heart disease of native coronary artery without angina pectoris: Secondary | ICD-10-CM | POA: Diagnosis not present

## 2022-03-25 DIAGNOSIS — M1712 Unilateral primary osteoarthritis, left knee: Secondary | ICD-10-CM | POA: Diagnosis not present

## 2022-05-27 ENCOUNTER — Ambulatory Visit: Admit: 2022-05-27 | Discharge: 2022-05-28 | Payer: MEDICARE

## 2022-05-27 DIAGNOSIS — F558 Abuse of other non-psychoactive substances: Principal | ICD-10-CM

## 2022-05-27 DIAGNOSIS — K760 Fatty (change of) liver, not elsewhere classified: Principal | ICD-10-CM

## 2022-05-27 DIAGNOSIS — K7460 Unspecified cirrhosis of liver: Principal | ICD-10-CM

## 2022-05-27 DIAGNOSIS — Z86010 Personal history of colonic polyps: Secondary | ICD-10-CM | POA: Diagnosis not present

## 2022-05-27 DIAGNOSIS — Z8619 Personal history of other infectious and parasitic diseases: Secondary | ICD-10-CM | POA: Diagnosis not present

## 2022-05-27 DIAGNOSIS — M1991 Primary osteoarthritis, unspecified site: Secondary | ICD-10-CM | POA: Diagnosis not present

## 2022-05-27 DIAGNOSIS — R413 Other amnesia: Secondary | ICD-10-CM | POA: Diagnosis not present

## 2022-05-27 DIAGNOSIS — F17200 Nicotine dependence, unspecified, uncomplicated: Secondary | ICD-10-CM | POA: Diagnosis not present

## 2022-05-27 DIAGNOSIS — J449 Chronic obstructive pulmonary disease, unspecified: Secondary | ICD-10-CM | POA: Diagnosis not present

## 2022-05-27 MED ORDER — CARVEDILOL 3.125 MG TABLET
ORAL_TABLET | Freq: Two times a day (BID) | ORAL | 11 refills | 30 days | Status: CN
Start: 2022-05-27 — End: 2023-05-27

## 2022-06-01 ENCOUNTER — Ambulatory Visit: Admit: 2022-06-01 | Discharge: 2022-06-02 | Payer: MEDICARE

## 2022-06-01 DIAGNOSIS — K7689 Other specified diseases of liver: Secondary | ICD-10-CM | POA: Diagnosis not present

## 2022-06-01 DIAGNOSIS — K8020 Calculus of gallbladder without cholecystitis without obstruction: Secondary | ICD-10-CM | POA: Diagnosis not present

## 2022-06-01 DIAGNOSIS — K7460 Unspecified cirrhosis of liver: Secondary | ICD-10-CM | POA: Diagnosis not present

## 2022-06-04 ENCOUNTER — Telehealth: Admit: 2022-06-04 | Discharge: 2022-06-05 | Payer: MEDICARE | Attending: Registered" | Primary: Registered"

## 2022-06-04 DIAGNOSIS — K760 Fatty (change of) liver, not elsewhere classified: Principal | ICD-10-CM

## 2022-08-11 MED ORDER — PEG 3350-ELECTROLYTES 236 GRAM-22.74 GRAM-6.74 GRAM-5.86 GRAM SOLUTION
Freq: Once | ORAL | 0 refills | 1 days | Status: CP
Start: 2022-08-11 — End: 2022-08-11

## 2022-08-13 MED FILL — PEG 3350-ELECTROLYTES 236 GRAM-22.74 GRAM-6.74 GRAM-5.86 GRAM SOLUTION: ORAL | 1 days supply | Qty: 4000 | Fill #0

## 2022-08-18 DIAGNOSIS — Z1231 Encounter for screening mammogram for malignant neoplasm of breast: Secondary | ICD-10-CM | POA: Diagnosis not present

## 2022-08-18 DIAGNOSIS — Z136 Encounter for screening for cardiovascular disorders: Secondary | ICD-10-CM | POA: Diagnosis not present

## 2022-08-18 DIAGNOSIS — Z0000 Encounter for general adult medical examination without abnormal findings: Secondary | ICD-10-CM | POA: Diagnosis not present

## 2022-08-23 ENCOUNTER — Encounter: Admit: 2022-08-23 | Discharge: 2022-08-23 | Payer: MEDICARE

## 2022-08-23 ENCOUNTER — Ambulatory Visit: Admit: 2022-08-23 | Discharge: 2022-08-23 | Payer: MEDICARE

## 2022-08-23 DIAGNOSIS — Z1211 Encounter for screening for malignant neoplasm of colon: Secondary | ICD-10-CM | POA: Diagnosis not present

## 2022-08-23 DIAGNOSIS — Z86010 Personal history of colonic polyps: Secondary | ICD-10-CM | POA: Diagnosis not present

## 2022-08-23 DIAGNOSIS — K644 Residual hemorrhoidal skin tags: Secondary | ICD-10-CM | POA: Diagnosis not present

## 2022-08-23 DIAGNOSIS — K635 Polyp of colon: Secondary | ICD-10-CM | POA: Diagnosis not present

## 2022-08-27 DIAGNOSIS — B079 Viral wart, unspecified: Secondary | ICD-10-CM | POA: Diagnosis not present

## 2022-09-10 DIAGNOSIS — B079 Viral wart, unspecified: Secondary | ICD-10-CM | POA: Diagnosis not present

## 2022-09-15 DIAGNOSIS — N201 Calculus of ureter: Secondary | ICD-10-CM | POA: Diagnosis not present

## 2022-09-15 DIAGNOSIS — M1990 Unspecified osteoarthritis, unspecified site: Secondary | ICD-10-CM | POA: Diagnosis not present

## 2022-09-15 DIAGNOSIS — R1032 Left lower quadrant pain: Secondary | ICD-10-CM | POA: Diagnosis not present

## 2022-09-15 DIAGNOSIS — N12 Tubulo-interstitial nephritis, not specified as acute or chronic: Secondary | ICD-10-CM | POA: Diagnosis not present

## 2022-09-15 DIAGNOSIS — N132 Hydronephrosis with renal and ureteral calculous obstruction: Secondary | ICD-10-CM | POA: Diagnosis not present

## 2022-09-15 DIAGNOSIS — F17210 Nicotine dependence, cigarettes, uncomplicated: Secondary | ICD-10-CM | POA: Diagnosis not present

## 2022-09-15 DIAGNOSIS — Z743 Need for continuous supervision: Secondary | ICD-10-CM | POA: Diagnosis not present

## 2022-09-15 DIAGNOSIS — N202 Calculus of kidney with calculus of ureter: Secondary | ICD-10-CM | POA: Diagnosis not present

## 2022-09-15 DIAGNOSIS — R109 Unspecified abdominal pain: Secondary | ICD-10-CM | POA: Diagnosis not present

## 2022-09-15 DIAGNOSIS — R112 Nausea with vomiting, unspecified: Secondary | ICD-10-CM | POA: Diagnosis not present

## 2022-09-15 DIAGNOSIS — Z791 Long term (current) use of non-steroidal anti-inflammatories (NSAID): Secondary | ICD-10-CM | POA: Diagnosis not present

## 2022-09-15 DIAGNOSIS — R932 Abnormal findings on diagnostic imaging of liver and biliary tract: Secondary | ICD-10-CM | POA: Diagnosis not present

## 2022-09-15 DIAGNOSIS — K7689 Other specified diseases of liver: Secondary | ICD-10-CM | POA: Diagnosis not present

## 2022-09-16 ENCOUNTER — Ambulatory Visit
Admit: 2022-09-16 | Discharge: 2022-09-16 | Disposition: A | Discharge status: Home / Self Care | Payer: MEDICARE | Attending: Student in an Organized Health Care Education/Training Program

## 2022-09-16 ENCOUNTER — Emergency Department
Admit: 2022-09-16 | Discharge: 2022-09-16 | Disposition: A | Discharge status: Home / Self Care | Payer: MEDICARE | Attending: Student in an Organized Health Care Education/Training Program

## 2022-09-16 DIAGNOSIS — R932 Abnormal findings on diagnostic imaging of liver and biliary tract: Secondary | ICD-10-CM | POA: Diagnosis not present

## 2022-09-16 DIAGNOSIS — K7689 Other specified diseases of liver: Secondary | ICD-10-CM | POA: Diagnosis not present

## 2022-09-16 DIAGNOSIS — N201 Calculus of ureter: Secondary | ICD-10-CM | POA: Diagnosis not present

## 2022-09-16 MED ORDER — NAPROXEN 500 MG TABLET
ORAL_TABLET | Freq: Two times a day (BID) | ORAL | 0 refills | 15 days | Status: CP
Start: 2022-09-16 — End: 2022-10-01

## 2022-09-19 MED ORDER — CEFDINIR 300 MG CAPSULE
ORAL_CAPSULE | Freq: Two times a day (BID) | ORAL | 0 refills | 10 days | Status: CP
Start: 2022-09-19 — End: 2022-09-29

## 2022-10-01 DIAGNOSIS — M25512 Pain in left shoulder: Secondary | ICD-10-CM | POA: Diagnosis not present

## 2022-10-01 DIAGNOSIS — G8929 Other chronic pain: Secondary | ICD-10-CM | POA: Diagnosis not present

## 2022-10-01 DIAGNOSIS — L570 Actinic keratosis: Secondary | ICD-10-CM | POA: Diagnosis not present

## 2022-11-26 DIAGNOSIS — H9193 Unspecified hearing loss, bilateral: Secondary | ICD-10-CM | POA: Diagnosis not present

## 2022-11-26 DIAGNOSIS — E038 Other specified hypothyroidism: Secondary | ICD-10-CM | POA: Diagnosis not present

## 2022-11-26 DIAGNOSIS — J0110 Acute frontal sinusitis, unspecified: Secondary | ICD-10-CM | POA: Diagnosis not present

## 2022-11-26 DIAGNOSIS — R413 Other amnesia: Secondary | ICD-10-CM | POA: Diagnosis not present

## 2022-11-26 DIAGNOSIS — H9313 Tinnitus, bilateral: Secondary | ICD-10-CM | POA: Diagnosis not present

## 2022-11-26 DIAGNOSIS — H8113 Benign paroxysmal vertigo, bilateral: Secondary | ICD-10-CM | POA: Diagnosis not present

## 2022-12-06 ENCOUNTER — Ambulatory Visit: Admit: 2022-12-06 | Discharge: 2022-12-07 | Payer: MEDICARE

## 2022-12-06 DIAGNOSIS — K7460 Unspecified cirrhosis of liver: Principal | ICD-10-CM

## 2022-12-19 ENCOUNTER — Ambulatory Visit: Admit: 2022-12-19 | Discharge: 2022-12-20 | Payer: MEDICARE

## 2022-12-19 DIAGNOSIS — K7460 Unspecified cirrhosis of liver: Secondary | ICD-10-CM | POA: Diagnosis not present

## 2022-12-19 DIAGNOSIS — R932 Abnormal findings on diagnostic imaging of liver and biliary tract: Secondary | ICD-10-CM | POA: Diagnosis not present

## 2023-01-13 DIAGNOSIS — H903 Sensorineural hearing loss, bilateral: Secondary | ICD-10-CM | POA: Diagnosis not present

## 2023-01-13 DIAGNOSIS — H9313 Tinnitus, bilateral: Secondary | ICD-10-CM | POA: Diagnosis not present

## 2023-01-27 DIAGNOSIS — R9431 Abnormal electrocardiogram [ECG] [EKG]: Secondary | ICD-10-CM | POA: Diagnosis not present

## 2023-01-27 DIAGNOSIS — H9313 Tinnitus, bilateral: Secondary | ICD-10-CM | POA: Diagnosis not present

## 2023-01-27 DIAGNOSIS — J341 Cyst and mucocele of nose and nasal sinus: Secondary | ICD-10-CM | POA: Diagnosis not present

## 2023-01-27 DIAGNOSIS — J349 Unspecified disorder of nose and nasal sinuses: Secondary | ICD-10-CM | POA: Diagnosis not present

## 2023-01-27 DIAGNOSIS — R42 Dizziness and giddiness: Secondary | ICD-10-CM | POA: Diagnosis not present

## 2023-01-27 DIAGNOSIS — J328 Other chronic sinusitis: Secondary | ICD-10-CM | POA: Diagnosis not present

## 2023-01-27 DIAGNOSIS — Z1152 Encounter for screening for COVID-19: Secondary | ICD-10-CM | POA: Diagnosis not present

## 2023-02-01 DIAGNOSIS — M7542 Impingement syndrome of left shoulder: Secondary | ICD-10-CM | POA: Diagnosis not present

## 2023-02-04 ENCOUNTER — Telehealth: Payer: Self-pay | Admitting: *Deleted

## 2023-02-04 NOTE — Telephone Encounter (Signed)
        Patient  visited [facility] on 01/27/2023  for treatment   Telephone encounter attempt :  1st  A HIPAA compliant voice message was left requesting a return call.  Instructed patient to call back at [phone].  [place] [phone] [address] , [place] [address] Email : [email or web address]

## 2023-02-09 ENCOUNTER — Telehealth: Payer: Self-pay | Admitting: *Deleted

## 2023-02-09 NOTE — Telephone Encounter (Signed)
        Patient  visited [facility] on 01/27/2023  for treatment    Telephone encounter attempt :  2nd  A HIPAA compliant voice message was left requesting a return call.  Instructed patient to call back at [phone].  [facility] [phone] [address] [place] , [place] [address] Email : [email or web address]

## 2023-03-02 DIAGNOSIS — F17200 Nicotine dependence, unspecified, uncomplicated: Principal | ICD-10-CM

## 2023-03-02 DIAGNOSIS — Z87891 Personal history of nicotine dependence: Principal | ICD-10-CM

## 2023-03-14 ENCOUNTER — Ambulatory Visit: Admit: 2023-03-14 | Discharge: 2023-03-15 | Payer: MEDICARE

## 2023-03-14 DIAGNOSIS — Z87891 Personal history of nicotine dependence: Secondary | ICD-10-CM | POA: Diagnosis not present

## 2023-07-13 DIAGNOSIS — G8929 Other chronic pain: Secondary | ICD-10-CM | POA: Diagnosis not present

## 2023-07-13 DIAGNOSIS — M25512 Pain in left shoulder: Secondary | ICD-10-CM | POA: Diagnosis not present

## 2023-07-20 DIAGNOSIS — S46812A Strain of other muscles, fascia and tendons at shoulder and upper arm level, left arm, initial encounter: Secondary | ICD-10-CM | POA: Diagnosis not present

## 2023-07-20 DIAGNOSIS — M75112 Incomplete rotator cuff tear or rupture of left shoulder, not specified as traumatic: Secondary | ICD-10-CM | POA: Diagnosis not present

## 2023-07-20 DIAGNOSIS — M7582 Other shoulder lesions, left shoulder: Secondary | ICD-10-CM | POA: Diagnosis not present

## 2023-07-20 DIAGNOSIS — M25412 Effusion, left shoulder: Secondary | ICD-10-CM | POA: Diagnosis not present

## 2023-07-20 DIAGNOSIS — M25512 Pain in left shoulder: Secondary | ICD-10-CM | POA: Diagnosis not present

## 2023-07-29 DIAGNOSIS — M19012 Primary osteoarthritis, left shoulder: Secondary | ICD-10-CM | POA: Diagnosis not present

## 2023-08-03 DIAGNOSIS — R350 Frequency of micturition: Secondary | ICD-10-CM | POA: Diagnosis not present

## 2023-08-03 DIAGNOSIS — Z9181 History of falling: Secondary | ICD-10-CM | POA: Diagnosis not present

## 2023-08-03 DIAGNOSIS — Z139 Encounter for screening, unspecified: Secondary | ICD-10-CM | POA: Diagnosis not present

## 2024-02-06 DIAGNOSIS — M19012 Primary osteoarthritis, left shoulder: Secondary | ICD-10-CM | POA: Diagnosis not present

## 2024-04-17 ENCOUNTER — Emergency Department
Admit: 2024-04-17 | Discharge: 2024-04-17 | Disposition: A | Discharge status: Home / Self Care | Payer: Medicare (Managed Care) | Attending: Family Medicine

## 2024-04-17 DIAGNOSIS — W19XXXA Unspecified fall, initial encounter: Principal | ICD-10-CM

## 2024-04-17 DIAGNOSIS — S32010A Wedge compression fracture of first lumbar vertebra, initial encounter for closed fracture: Principal | ICD-10-CM

## 2024-04-17 DIAGNOSIS — S22080A Wedge compression fracture of T11-T12 vertebra, initial encounter for closed fracture: Principal | ICD-10-CM

## 2024-04-17 DIAGNOSIS — B182 Chronic viral hepatitis C: Secondary | ICD-10-CM | POA: Diagnosis not present

## 2024-04-17 DIAGNOSIS — S32019A Unspecified fracture of first lumbar vertebra, initial encounter for closed fracture: Secondary | ICD-10-CM | POA: Diagnosis not present

## 2024-04-17 DIAGNOSIS — M8580 Other specified disorders of bone density and structure, unspecified site: Secondary | ICD-10-CM | POA: Diagnosis not present

## 2024-04-17 DIAGNOSIS — S22089A Unspecified fracture of T11-T12 vertebra, initial encounter for closed fracture: Secondary | ICD-10-CM | POA: Diagnosis not present

## 2024-04-17 DIAGNOSIS — M549 Dorsalgia, unspecified: Secondary | ICD-10-CM | POA: Diagnosis not present

## 2024-04-17 DIAGNOSIS — M51369 Other intervertebral disc degeneration, lumbar region without mention of lumbar back pain or lower extremity pain: Secondary | ICD-10-CM | POA: Diagnosis not present

## 2024-04-17 DIAGNOSIS — Z743 Need for continuous supervision: Secondary | ICD-10-CM | POA: Diagnosis not present

## 2024-04-17 DIAGNOSIS — M8588 Other specified disorders of bone density and structure, other site: Secondary | ICD-10-CM | POA: Diagnosis not present

## 2024-04-17 DIAGNOSIS — K7460 Unspecified cirrhosis of liver: Secondary | ICD-10-CM | POA: Diagnosis not present

## 2024-04-17 DIAGNOSIS — S22088A Other fracture of T11-T12 vertebra, initial encounter for closed fracture: Secondary | ICD-10-CM | POA: Diagnosis not present

## 2024-04-17 DIAGNOSIS — M51379 Other intervertebral disc degeneration, lumbosacral region without mention of lumbar back pain or lower extremity pain: Secondary | ICD-10-CM | POA: Diagnosis not present

## 2024-04-17 DIAGNOSIS — M545 Low back pain, unspecified: Secondary | ICD-10-CM | POA: Diagnosis not present

## 2024-04-17 DIAGNOSIS — M4855XA Collapsed vertebra, not elsewhere classified, thoracolumbar region, initial encounter for fracture: Secondary | ICD-10-CM | POA: Diagnosis not present

## 2024-04-17 DIAGNOSIS — R6889 Other general symptoms and signs: Secondary | ICD-10-CM | POA: Diagnosis not present

## 2024-04-17 DIAGNOSIS — R102 Pelvic and perineal pain: Secondary | ICD-10-CM | POA: Diagnosis not present

## 2024-04-17 DIAGNOSIS — F17210 Nicotine dependence, cigarettes, uncomplicated: Secondary | ICD-10-CM | POA: Diagnosis not present

## 2024-04-17 MED ORDER — OXYCODONE 5 MG TABLET
ORAL_TABLET | Freq: Four times a day (QID) | ORAL | 0 refills | 3.00000 days | Status: CP | PRN
Start: 2024-04-17 — End: 2024-04-22

## 2024-04-25 DIAGNOSIS — Z1231 Encounter for screening mammogram for malignant neoplasm of breast: Secondary | ICD-10-CM | POA: Diagnosis not present

## 2024-04-25 DIAGNOSIS — M545 Low back pain, unspecified: Secondary | ICD-10-CM | POA: Diagnosis not present

## 2024-04-25 DIAGNOSIS — Z1331 Encounter for screening for depression: Secondary | ICD-10-CM | POA: Diagnosis not present

## 2024-04-25 DIAGNOSIS — S32010A Wedge compression fracture of first lumbar vertebra, initial encounter for closed fracture: Secondary | ICD-10-CM | POA: Diagnosis not present

## 2024-04-25 DIAGNOSIS — S22080A Wedge compression fracture of T11-T12 vertebra, initial encounter for closed fracture: Secondary | ICD-10-CM | POA: Diagnosis not present

## 2024-06-04 DIAGNOSIS — R509 Fever, unspecified: Secondary | ICD-10-CM | POA: Diagnosis not present

## 2024-06-04 DIAGNOSIS — Z20822 Contact with and (suspected) exposure to covid-19: Secondary | ICD-10-CM | POA: Diagnosis not present

## 2024-06-04 DIAGNOSIS — Z1152 Encounter for screening for COVID-19: Secondary | ICD-10-CM | POA: Diagnosis not present

## 2024-06-04 DIAGNOSIS — R519 Headache, unspecified: Secondary | ICD-10-CM | POA: Diagnosis not present

## 2024-06-04 DIAGNOSIS — R590 Localized enlarged lymph nodes: Secondary | ICD-10-CM | POA: Diagnosis not present

## 2024-06-04 DIAGNOSIS — U071 COVID-19: Secondary | ICD-10-CM | POA: Diagnosis not present

## 2024-06-04 DIAGNOSIS — E7800 Pure hypercholesterolemia, unspecified: Secondary | ICD-10-CM | POA: Diagnosis not present

## 2024-06-15 DIAGNOSIS — M545 Low back pain, unspecified: Secondary | ICD-10-CM | POA: Diagnosis not present

## 2024-07-18 ENCOUNTER — Encounter: Payer: Self-pay | Admitting: Physician Assistant

## 2024-07-19 DIAGNOSIS — Z79891 Long term (current) use of opiate analgesic: Secondary | ICD-10-CM | POA: Diagnosis not present

## 2024-07-19 DIAGNOSIS — M5459 Other low back pain: Secondary | ICD-10-CM | POA: Diagnosis not present

## 2024-08-06 DIAGNOSIS — F1110 Opioid abuse, uncomplicated: Secondary | ICD-10-CM | POA: Diagnosis not present

## 2024-08-06 DIAGNOSIS — E669 Obesity, unspecified: Secondary | ICD-10-CM | POA: Diagnosis not present

## 2024-08-06 DIAGNOSIS — F17210 Nicotine dependence, cigarettes, uncomplicated: Secondary | ICD-10-CM | POA: Diagnosis not present

## 2024-08-06 DIAGNOSIS — E039 Hypothyroidism, unspecified: Secondary | ICD-10-CM | POA: Diagnosis not present

## 2024-08-06 DIAGNOSIS — Z9181 History of falling: Secondary | ICD-10-CM | POA: Diagnosis not present

## 2024-08-06 DIAGNOSIS — F321 Major depressive disorder, single episode, moderate: Secondary | ICD-10-CM | POA: Diagnosis not present

## 2024-08-06 DIAGNOSIS — I2510 Atherosclerotic heart disease of native coronary artery without angina pectoris: Secondary | ICD-10-CM | POA: Diagnosis not present

## 2024-08-06 DIAGNOSIS — Z6831 Body mass index (BMI) 31.0-31.9, adult: Secondary | ICD-10-CM | POA: Diagnosis not present

## 2024-08-06 DIAGNOSIS — Z008 Encounter for other general examination: Secondary | ICD-10-CM | POA: Diagnosis not present

## 2024-08-06 DIAGNOSIS — R269 Unspecified abnormalities of gait and mobility: Secondary | ICD-10-CM | POA: Diagnosis not present

## 2024-08-06 DIAGNOSIS — F1021 Alcohol dependence, in remission: Secondary | ICD-10-CM | POA: Diagnosis not present

## 2024-08-21 DIAGNOSIS — Z139 Encounter for screening, unspecified: Secondary | ICD-10-CM | POA: Diagnosis not present

## 2024-08-21 DIAGNOSIS — E038 Other specified hypothyroidism: Secondary | ICD-10-CM | POA: Diagnosis not present

## 2024-08-21 DIAGNOSIS — Z9181 History of falling: Secondary | ICD-10-CM | POA: Diagnosis not present

## 2024-08-21 DIAGNOSIS — K7460 Unspecified cirrhosis of liver: Secondary | ICD-10-CM | POA: Diagnosis not present

## 2024-08-21 DIAGNOSIS — R7989 Other specified abnormal findings of blood chemistry: Secondary | ICD-10-CM | POA: Diagnosis not present

## 2024-08-21 DIAGNOSIS — E2839 Other primary ovarian failure: Secondary | ICD-10-CM | POA: Diagnosis not present

## 2024-08-21 DIAGNOSIS — R7303 Prediabetes: Secondary | ICD-10-CM | POA: Diagnosis not present

## 2024-08-21 DIAGNOSIS — L299 Pruritus, unspecified: Secondary | ICD-10-CM | POA: Diagnosis not present

## 2024-08-21 DIAGNOSIS — J439 Emphysema, unspecified: Secondary | ICD-10-CM | POA: Diagnosis not present

## 2024-08-27 ENCOUNTER — Emergency Department
Admit: 2024-08-27 | Discharge: 2024-08-27 | Disposition: A | Discharge status: Home / Self Care | Payer: Medicare (Managed Care)

## 2024-08-27 DIAGNOSIS — N200 Calculus of kidney: Principal | ICD-10-CM

## 2024-08-27 DIAGNOSIS — Z87891 Personal history of nicotine dependence: Secondary | ICD-10-CM | POA: Diagnosis not present

## 2024-08-27 DIAGNOSIS — Z87442 Personal history of urinary calculi: Secondary | ICD-10-CM | POA: Diagnosis not present

## 2024-08-27 DIAGNOSIS — M51369 Other intervertebral disc degeneration, lumbar region without mention of lumbar back pain or lower extremity pain: Secondary | ICD-10-CM | POA: Diagnosis not present

## 2024-08-27 DIAGNOSIS — Z8619 Personal history of other infectious and parasitic diseases: Secondary | ICD-10-CM | POA: Diagnosis not present

## 2024-08-27 DIAGNOSIS — N132 Hydronephrosis with renal and ureteral calculous obstruction: Secondary | ICD-10-CM | POA: Diagnosis not present

## 2024-08-27 DIAGNOSIS — M4854XD Collapsed vertebra, not elsewhere classified, thoracic region, subsequent encounter for fracture with routine healing: Secondary | ICD-10-CM | POA: Diagnosis not present

## 2024-08-27 MED ORDER — ONDANSETRON 4 MG DISINTEGRATING TABLET
ORAL_TABLET | Freq: Three times a day (TID) | ORAL | 0 refills | 4.00000 days | Status: CP | PRN
Start: 2024-08-27 — End: 2024-09-03

## 2024-08-28 ENCOUNTER — Emergency Department
Admit: 2024-08-28 | Discharge: 2024-08-29 | Disposition: A | Discharge status: Home / Self Care | Payer: Medicare (Managed Care) | Attending: Family Medicine

## 2024-08-28 DIAGNOSIS — N23 Unspecified renal colic: Principal | ICD-10-CM

## 2024-08-28 DIAGNOSIS — M1990 Unspecified osteoarthritis, unspecified site: Secondary | ICD-10-CM | POA: Diagnosis not present

## 2024-08-28 DIAGNOSIS — F1290 Cannabis use, unspecified, uncomplicated: Secondary | ICD-10-CM | POA: Diagnosis not present

## 2024-08-28 DIAGNOSIS — Z79899 Other long term (current) drug therapy: Secondary | ICD-10-CM | POA: Diagnosis not present

## 2024-08-28 DIAGNOSIS — N201 Calculus of ureter: Secondary | ICD-10-CM | POA: Diagnosis not present

## 2024-08-28 DIAGNOSIS — Z87891 Personal history of nicotine dependence: Secondary | ICD-10-CM | POA: Diagnosis not present

## 2024-08-28 MED ORDER — OXYCODONE 5 MG TABLET
ORAL_TABLET | Freq: Four times a day (QID) | ORAL | 0 refills | 2.00000 days | Status: CP | PRN
Start: 2024-08-28 — End: 2024-09-02

## 2024-08-28 MED ORDER — TAMSULOSIN 0.4 MG CAPSULE
ORAL_CAPSULE | Freq: Every day | ORAL | 0 refills | 14.00000 days | Status: CP
Start: 2024-08-28 — End: 2024-09-11

## 2024-09-06 ENCOUNTER — Ambulatory Visit: Admitting: Physician Assistant
